# Patient Record
Sex: Male | Born: 1970 | Race: Black or African American | Hispanic: No | Marital: Married | Smoking: Never smoker
Health system: Southern US, Community
[De-identification: ages and names within clinical notes are randomized; demographics above are authoritative.]

## PROBLEM LIST (undated history)

## (undated) DIAGNOSIS — N529 Male erectile dysfunction, unspecified: Secondary | ICD-10-CM

## (undated) DIAGNOSIS — J189 Pneumonia, unspecified organism: Secondary | ICD-10-CM

## (undated) DIAGNOSIS — E669 Obesity, unspecified: Secondary | ICD-10-CM

## (undated) DIAGNOSIS — E785 Hyperlipidemia, unspecified: Secondary | ICD-10-CM

## (undated) HISTORY — DX: Male erectile dysfunction, unspecified: N52.9

## (undated) HISTORY — PX: FOOT FRACTURE SURGERY: SHX645

## (undated) HISTORY — DX: Hyperlipidemia, unspecified: E78.5

## (undated) HISTORY — DX: Obesity, unspecified: E66.9

## (undated) HISTORY — PX: FRACTURE SURGERY: SHX138

---

## 2016-04-06 DIAGNOSIS — L218 Other seborrheic dermatitis: Secondary | ICD-10-CM | POA: Diagnosis not present

## 2016-08-25 DIAGNOSIS — L219 Seborrheic dermatitis, unspecified: Secondary | ICD-10-CM | POA: Diagnosis not present

## 2017-02-02 ENCOUNTER — Other Ambulatory Visit: Payer: Self-pay | Admitting: Occupational Medicine

## 2017-02-02 ENCOUNTER — Ambulatory Visit: Payer: Self-pay

## 2017-02-02 DIAGNOSIS — Z0000 Encounter for general adult medical examination without abnormal findings: Secondary | ICD-10-CM

## 2017-02-08 ENCOUNTER — Ambulatory Visit (INDEPENDENT_AMBULATORY_CARE_PROVIDER_SITE_OTHER): Payer: BLUE CROSS/BLUE SHIELD | Admitting: Adult Health

## 2017-02-08 ENCOUNTER — Encounter: Payer: Self-pay | Admitting: Adult Health

## 2017-02-08 VITALS — BP 122/84 | Temp 98.3°F | Ht 69.0 in | Wt 275.2 lb

## 2017-02-08 DIAGNOSIS — R911 Solitary pulmonary nodule: Secondary | ICD-10-CM | POA: Diagnosis not present

## 2017-02-08 DIAGNOSIS — Z0000 Encounter for general adult medical examination without abnormal findings: Secondary | ICD-10-CM | POA: Diagnosis not present

## 2017-02-08 DIAGNOSIS — N529 Male erectile dysfunction, unspecified: Secondary | ICD-10-CM

## 2017-02-08 LAB — BASIC METABOLIC PANEL
BUN: 15 mg/dL (ref 6–23)
CO2: 27 mEq/L (ref 19–32)
Calcium: 9.2 mg/dL (ref 8.4–10.5)
Chloride: 105 mEq/L (ref 96–112)
Creatinine, Ser: 0.92 mg/dL (ref 0.40–1.50)
GFR: 113.7 mL/min (ref >60.00)
Glucose, Bld: 112 mg/dL — ABNORMAL HIGH (ref 70–99)
POTASSIUM: 4.2 meq/L (ref 3.5–5.1)
SODIUM: 139 meq/L (ref 135–145)

## 2017-02-08 LAB — HEPATIC FUNCTION PANEL
ALBUMIN: 4.1 g/dL (ref 3.5–5.2)
ALT: 27 U/L (ref 0–53)
AST: 23 U/L (ref 0–37)
Alkaline Phosphatase: 97 U/L (ref 39–117)
Bilirubin, Direct: 0.1 mg/dL (ref 0.0–0.3)
Total Bilirubin: 0.6 mg/dL (ref 0.2–1.2)
Total Protein: 7.2 g/dL (ref 6.0–8.3)

## 2017-02-08 LAB — LIPID PANEL
Cholesterol: 208 mg/dL — ABNORMAL HIGH (ref 0–200)
HDL: 52.1 mg/dL (ref >39.00)
LDL Cholesterol: 127 mg/dL — ABNORMAL HIGH (ref 0–99)
NONHDL: 156.19
Total CHOL/HDL Ratio: 4
Triglycerides: 148 mg/dL (ref 0.0–149.0)
VLDL: 29.6 mg/dL (ref 0.0–40.0)

## 2017-02-08 LAB — CBC WITH DIFFERENTIAL/PLATELET
Basophils Absolute: 0 10*3/uL (ref 0.0–0.1)
Basophils Relative: 0.8 % (ref 0.0–3.0)
Eosinophils Absolute: 0.1 10*3/uL (ref 0.0–0.7)
Eosinophils Relative: 3.1 % (ref 0.0–5.0)
HCT: 42.7 % (ref 39.0–52.0)
HEMOGLOBIN: 14.6 g/dL (ref 13.0–17.0)
Lymphocytes Relative: 28.7 % (ref 12.0–46.0)
Lymphs Abs: 1.2 10*3/uL (ref 0.7–4.0)
MCHC: 34.2 g/dL (ref 30.0–36.0)
MCV: 90.2 fl (ref 78.0–100.0)
MONOS PCT: 9.6 % (ref 3.0–12.0)
Monocytes Absolute: 0.4 10*3/uL (ref 0.1–1.0)
Neutro Abs: 2.4 10*3/uL (ref 1.4–7.7)
Neutrophils Relative %: 57.8 % (ref 43.0–77.0)
Platelets: 264 10*3/uL (ref 150.0–400.0)
RBC: 4.74 Mil/uL (ref 4.22–5.81)
RDW: 13.7 % (ref 11.5–15.5)
WBC: 4.1 10*3/uL (ref 4.0–10.5)

## 2017-02-08 LAB — HEMOGLOBIN A1C: HEMOGLOBIN A1C: 5.5 % (ref 4.6–6.5)

## 2017-02-08 LAB — TSH: TSH: 2.17 u[IU]/mL (ref 0.35–4.50)

## 2017-02-08 LAB — PSA: PSA: 0.53 ng/mL (ref 0.10–4.00)

## 2017-02-08 NOTE — Progress Notes (Signed)
Patient presents to clinic today to establish care. He is a pleasant 46 year old male who  has a past medical history of ED (erectile dysfunction).   Acute Concerns: Complete physical   Chronic Issues: Obesity - he has been prescribed Contrave in the past.   ED - he has trouble maintaining erections. This has been an ongoing issue for him. He is overweight and has a lot of stress at work  Pulmonary Nodule- had recent chest x ray done at work for his work physical. X ray showed a nodular density in the right lung that did not correspond with nipple marker. It is recommended that he follow up for a CT. 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 He does not always wear a respirator or maske.   Health Maintenance: Dental -- Routine  Vision -- Routine  Immunizations -- UTD  Diet: Does not follow a specific diet. He travels a lot for work.  Exercise: Does not do routine care   Past Medical History:  Diagnosis Date  . ED (erectile dysfunction)     No past surgical history on file.  No current outpatient prescriptions on file prior to visit.   No current facility-administered medications on file prior to visit.     No Known Allergies  Family History  Problem Relation Age of Onset  . Ovarian cancer Mother   . Stroke Maternal Grandmother     Social History   Social History  . Marital status: Unknown    Spouse name: N/A  . Number of children: N/A  . Years of education: N/A   Occupational History  . Not on file.   Social History Main Topics  . Smoking status: Never Smoker  . Smokeless tobacco: Never Used  . Alcohol use Yes     Comment: "social"  . Drug use: No  . Sexual activity: Not on file   Other Topics Concern  . Not on file   Social History Narrative  . No narrative on file    Review of Systems  Constitutional: Negative.   HENT: Negative.   Eyes: Negative.   Respiratory: Negative.   Cardiovascular: Negative.   Gastrointestinal:  Negative.   Genitourinary: Negative.   Musculoskeletal: Negative.   Skin: Negative.   Neurological: Negative.   Endo/Heme/Allergies: Negative.   Psychiatric/Behavioral: Negative.   All other systems reviewed and are negative.   BP 122/84 (BP Location: Left Arm, Patient Position: Sitting, Cuff Size: Normal)   Temp 98.3 F (36.8 C) (Oral)   Ht 5\' 9"  (1.753 m)   Wt 275 lb 3.2 oz (124.8 kg)   BMI 40.64 kg/m   Physical Exam  Constitutional: He is oriented to person, place, and time and well-developed, well-nourished, and in no distress. No distress.  Obese   HENT:  Head: Normocephalic and atraumatic.  Right Ear: External ear normal.  Left Ear: External ear normal.  Nose: Nose normal.  Mouth/Throat: Oropharynx is clear and moist. No oropharyngeal exudate.  Eyes: Conjunctivae and EOM are normal. Pupils are equal, round, and reactive to light. Right eye exhibits no discharge. Left eye exhibits no discharge. No scleral icterus.  Neck: Trachea normal and normal range of motion. Neck supple. No JVD present. Carotid bruit is not present. No tracheal deviation present. No thyroid mass and no thyromegaly present.  Cardiovascular: Normal rate, regular rhythm, normal heart sounds and intact distal pulses.  Exam reveals no gallop and no friction rub.   No murmur heard. Pulmonary/Chest:  Effort normal and breath sounds normal. No stridor. No respiratory distress. He has no wheezes. He has no rales. He exhibits no tenderness.  Abdominal: Soft. Bowel sounds are normal. He exhibits no distension and no mass. There is no tenderness. There is no rebound and no guarding.  Genitourinary:  Genitourinary Comments: deferred  Musculoskeletal: Normal range of motion. He exhibits no edema, tenderness or deformity.  Lymphadenopathy:    He has no cervical adenopathy.  Neurological: He is alert and oriented to person, place, and time. He displays normal reflexes. No cranial nerve deficit. He exhibits normal  muscle tone. Coordination normal. GCS score is 15.  Skin: Skin is warm and dry. No rash noted. He is not diaphoretic. No erythema. No pallor.  Psychiatric: Mood, memory, affect and judgment normal.  Nursing note and vitals reviewed.  Assessment/Plan: 1. Routine general medical examination at a health care facility - Basic metabolic panel - CBC with Differential/Platelet - Hepatic function panel - Hemoglobin A1c - Lipid panel - TSH - PSA - Testosterone, Free, Total, SHBG - I am going to have him work on diet and exercise and return in 3 months to talk about restarting Contrave  - Follow up sooner if needed   2. Erectile dysfunction, unspecified erectile dysfunction type  - Testosterone, Free, Total, SHBG - Consider referral to Urology  3. Pulmonary nodule  - CT CHEST WO CONTRAST; Future  Dorothyann Peng, NP

## 2017-02-08 NOTE — Addendum Note (Signed)
Addended by: Elmer Picker on: 02/08/2017 08:39 AM   Modules accepted: Orders

## 2017-02-08 NOTE — Addendum Note (Signed)
Addended by: Elmer Picker on: 02/08/2017 08:41 AM   Modules accepted: Orders

## 2017-02-08 NOTE — Patient Instructions (Signed)
It was great meeting you today!   Someone will call you to schedule your CT scan   I will call you with your lab results.   Please follow up with in 3 months to discuss weight loss

## 2017-02-09 LAB — CP2130 TESTOSTERONE, TOTAL AND FREE
SEX HORMONE BINDING: 19 nmol/L (ref 10–50)
TESTOSTERONE FREE: 62.7 pg/mL (ref 47.0–244.0)
TESTOSTERONE-% FREE: 2.6 % (ref 1.6–2.9)
Testosterone: 245 ng/dL — ABNORMAL LOW (ref 300–890)

## 2017-02-12 ENCOUNTER — Inpatient Hospital Stay: Admission: RE | Admit: 2017-02-12 | Payer: Self-pay | Source: Ambulatory Visit

## 2017-02-14 ENCOUNTER — Other Ambulatory Visit: Payer: Self-pay | Admitting: Adult Health

## 2017-02-14 DIAGNOSIS — R7989 Other specified abnormal findings of blood chemistry: Secondary | ICD-10-CM

## 2017-02-19 ENCOUNTER — Encounter: Payer: Self-pay | Admitting: Adult Health

## 2017-02-20 ENCOUNTER — Other Ambulatory Visit: Payer: Self-pay | Admitting: Adult Health

## 2017-02-20 ENCOUNTER — Ambulatory Visit: Payer: Self-pay | Admitting: Adult Health

## 2017-02-20 DIAGNOSIS — N529 Male erectile dysfunction, unspecified: Secondary | ICD-10-CM

## 2017-02-23 ENCOUNTER — Other Ambulatory Visit: Payer: Self-pay | Admitting: Adult Health

## 2017-02-23 ENCOUNTER — Ambulatory Visit (INDEPENDENT_AMBULATORY_CARE_PROVIDER_SITE_OTHER)
Admission: RE | Admit: 2017-02-23 | Discharge: 2017-02-23 | Disposition: A | Discharge status: Home / Self Care | Payer: BLUE CROSS/BLUE SHIELD | Source: Ambulatory Visit | Attending: Adult Health | Admitting: Adult Health

## 2017-02-23 DIAGNOSIS — R911 Solitary pulmonary nodule: Secondary | ICD-10-CM

## 2017-03-15 DIAGNOSIS — N5201 Erectile dysfunction due to arterial insufficiency: Secondary | ICD-10-CM | POA: Diagnosis not present

## 2017-03-15 DIAGNOSIS — E349 Endocrine disorder, unspecified: Secondary | ICD-10-CM | POA: Diagnosis not present

## 2017-03-26 ENCOUNTER — Encounter: Payer: Self-pay | Admitting: Family Medicine

## 2017-05-09 ENCOUNTER — Encounter: Payer: Self-pay | Admitting: Adult Health

## 2017-05-09 ENCOUNTER — Ambulatory Visit (INDEPENDENT_AMBULATORY_CARE_PROVIDER_SITE_OTHER): Payer: BLUE CROSS/BLUE SHIELD | Admitting: Adult Health

## 2017-05-09 VITALS — BP 118/64 | Temp 98.5°F | Ht 69.0 in | Wt 267.9 lb

## 2017-05-09 DIAGNOSIS — Z713 Dietary counseling and surveillance: Secondary | ICD-10-CM | POA: Diagnosis not present

## 2017-05-09 MED ORDER — NALTREXONE-BUPROPION HCL ER 8-90 MG PO TB12: ORAL_TABLET | ORAL | 0 refills | Status: DC

## 2017-05-09 MED ORDER — NALTREXONE-BUPROPION HCL ER 8-90 MG PO TB12
2.0000 | ORAL_TABLET | Freq: Two times a day (BID) | ORAL | 1 refills | Status: DC
Start: 2017-05-09 — End: 2017-06-05

## 2017-05-09 NOTE — Progress Notes (Signed)
Subjective:    Patient ID: Jesus Allen, male    DOB: 28-Sep-1971, 46 y.o.   MRN: [id number]  HPI 46 year old male who  has a past medical history of ED (erectile dysfunction). He presents to the office today to talk about weight loss therapy. His current weight is that of 267.9 which gives him a BMI of 39.   He has been doing the ' Insanity" workout as well as " T-25". He has cut back on sugar and beer and has noticed weight loss. His biggest barrier is not giving in to cravings and portion size.   He has tried Contrave in the past and reports that he did well with this medication.,   He reports that his goal weight is that of 230. Currently his pant size is 40.   Wt Readings from Last 3 Encounters:  05/09/17 267 lb 14.4 oz (121.5 kg)  02/08/17 275 lb 3.2 oz (124.8 kg)     Review of Systems  Constitutional: Negative.   Respiratory: Negative.   Genitourinary: Negative.   Neurological: Negative.   All other systems reviewed and are negative.  Past Medical History:  Diagnosis Date  . ED (erectile dysfunction)     Social History   Social History  . Marital status: Married    Spouse name: N/A  . Number of children: N/A  . Years of education: N/A   Occupational History  . Not on file.   Social History Main Topics  . Smoking status: Never Smoker  . Smokeless tobacco: Never Used  . Alcohol use Yes     Comment: "social"  . Drug use: No  . Sexual activity: Not on file   Other Topics Concern  . Not on file   Social History Narrative   Works as Engineer, building services    Has his MBA   Married    Two children ( 48 and 88)        No past surgical history on file.  Family History  Problem Relation Age of Onset  . Ovarian cancer Mother   . Stroke Maternal Grandmother     No Known Allergies  Current Outpatient Prescriptions on File Prior to Visit  Medication Sig Dispense Refill  . tadalafil (CIALIS) 10 MG tablet Take 10 mg by mouth daily as needed.      No current facility-administered medications on file prior to visit.     BP 118/64 (BP Location: Left Arm, Patient Position: Sitting, Cuff Size: Large)   Temp 98.5 F (36.9 C) (Oral)   Ht 5\' 9"  (1.753 m)   Wt 267 lb 14.4 oz (121.5 kg)   BMI 39.56 kg/m       Objective:   Physical Exam  Constitutional: He is oriented to person, place, and time. He appears well-developed and well-nourished. No distress.  Obese   Cardiovascular: Normal rate, regular rhythm, normal heart sounds and intact distal pulses.  Exam reveals no gallop and no friction rub.   No murmur heard. Pulmonary/Chest: Effort normal and breath sounds normal. No respiratory distress. He has no wheezes. He has no rales. He exhibits no tenderness.  Neurological: He is alert and oriented to person, place, and time.  Skin: Skin is warm and dry. No rash noted. He is not diaphoretic. No erythema. No pallor.  Psychiatric: He has a normal mood and affect. His behavior is normal. Judgment and thought content normal.  Nursing note and vitals reviewed.     Assessment & Plan:  1. Weight loss counseling, encounter for - We spoke about the side effects of medication  - We spoke about meal planning and continued exercise at length  - Naltrexone-Bupropion HCl ER 8-90 MG TB12; Take 2 tablets by mouth 2 (two) times daily. Two tablets twice per day  Dispense: 120 tablet; Refill: 1 - Follow up in one month and then in 3 month intervals.   Dorothyann Peng, NP

## 2017-05-11 ENCOUNTER — Ambulatory Visit: Payer: Self-pay | Admitting: Adult Health

## 2017-05-25 ENCOUNTER — Ambulatory Visit (INDEPENDENT_AMBULATORY_CARE_PROVIDER_SITE_OTHER)
Admission: RE | Admit: 2017-05-25 | Discharge: 2017-05-25 | Disposition: A | Discharge status: Home / Self Care | Payer: BLUE CROSS/BLUE SHIELD | Source: Ambulatory Visit | Attending: Adult Health | Admitting: Adult Health

## 2017-05-25 ENCOUNTER — Other Ambulatory Visit: Payer: Self-pay | Admitting: *Deleted

## 2017-05-25 DIAGNOSIS — R911 Solitary pulmonary nodule: Secondary | ICD-10-CM

## 2017-06-05 ENCOUNTER — Other Ambulatory Visit: Payer: Self-pay | Admitting: Adult Health

## 2017-06-05 DIAGNOSIS — Z713 Dietary counseling and surveillance: Secondary | ICD-10-CM

## 2017-06-06 NOTE — Telephone Encounter (Signed)
Called to the pharmacy and gave to [facility] verbally.

## 2017-06-06 NOTE — Telephone Encounter (Signed)
Ok to refill but change to two tablets BID, 0 refills

## 2017-06-06 NOTE — Telephone Encounter (Signed)
Per last note, pt to follow up in 1 month and then 3 month intervals.  Called and scheduled pt for 06/19/17 @ 1 PM.  Will forward to [facility] for authorization to fill.

## 2017-06-19 ENCOUNTER — Ambulatory Visit: Payer: Self-pay | Admitting: Adult Health

## 2017-07-09 ENCOUNTER — Other Ambulatory Visit: Payer: Self-pay | Admitting: Adult Health

## 2017-07-09 DIAGNOSIS — Z713 Dietary counseling and surveillance: Secondary | ICD-10-CM

## 2017-07-10 NOTE — Telephone Encounter (Signed)
Sent to the pharmacy by e-scribe. 

## 2017-07-10 NOTE — Telephone Encounter (Signed)
Ok to refill for 3 months.  

## 2017-08-03 ENCOUNTER — Encounter: Payer: Self-pay | Admitting: Adult Health

## 2017-08-08 ENCOUNTER — Encounter: Payer: Self-pay | Admitting: Adult Health

## 2017-08-08 ENCOUNTER — Ambulatory Visit (INDEPENDENT_AMBULATORY_CARE_PROVIDER_SITE_OTHER): Payer: BLUE CROSS/BLUE SHIELD | Admitting: Adult Health

## 2017-08-08 DIAGNOSIS — Z713 Dietary counseling and surveillance: Secondary | ICD-10-CM

## 2017-08-08 MED ORDER — NALTREXONE-BUPROPION HCL ER 8-90 MG PO TB12: ORAL_TABLET | ORAL | 1 refills | Status: DC

## 2017-08-08 NOTE — Progress Notes (Signed)
Subjective:    Patient ID: Jesus Allen, male    DOB: [birth year], 46 y.o.   MRN: [id number]  HPI  46 year old male who  has a past medical history of ED (erectile dysfunction) and Obesity. He presents to the office today for three month follow up regarding weight loss and obesity. During his last visit he was placed on Contrave. He had been doing aerobic exercise and working on incorporating a heart healthy diet. He was finding it hard to deal with cravings and portion sizes. His weight at this visit was 267.9 lbs and his pant size was 40.   Today in the office he reports that he has been walking 1.5 miles per day and is trying to eat healthy. He had to go to [employer] for a month for work and found it difficult to stay on track with his diet   He feels as though the Contrave is causing him to drink more water and he has noticed he is eating less when it comes to portion [employer] Readings from Last 3 Encounters:  08/08/17 263 lb (119.3 kg)  05/09/17 267 lb 14.4 oz (121.5 kg)  02/08/17 275 lb 3.2 oz (124.8 kg)     Review of Systems See HPI   Past Medical History:  Diagnosis Date  . ED (erectile dysfunction)   . Obesity     Social History   Social History  . Marital status: Married    Spouse name: N/A  . Number of children: N/A  . Years of education: N/A   Occupational History  . Not on file.   Social History Main Topics  . Smoking status: Never Smoker  . Smokeless tobacco: Never Used  . Alcohol use Yes     Comment: "social"  . Drug use: No  . Sexual activity: Not on file   Other Topics Concern  . 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  Family History  Problem Relation Age of Onset  . Ovarian cancer Mother   . Stroke Maternal Grandmother     No Known Allergies  Current Outpatient Prescriptions on File Prior  to Visit  Medication Sig Dispense Refill  . CONTRAVE 8-90 MG TB12 TAKE TWO TABLETS BY MOUTH TWO TIMES A DAILY 120 tablet 2  . tadalafil (CIALIS) 10 MG tablet Take 10 mg by mouth daily as needed.     No current facility-administered medications on file prior to visit.     There were no vitals taken for this visit.      Objective:   Physical Exam  Constitutional: He is oriented to person, place, and time. He appears well-developed and well-nourished. No distress.  Obese   Cardiovascular: Normal rate, regular rhythm, normal heart sounds and intact distal pulses.  Exam reveals no gallop and no friction rub.   No murmur heard. Pulmonary/Chest: Effort normal and breath sounds normal. No respiratory distress. He has no wheezes. He has no rales. He exhibits no tenderness.  Musculoskeletal: Normal range of motion. He exhibits no edema, tenderness or deformity.  Neurological: He is alert and oriented to person, place, and time. He has normal reflexes. He displays normal reflexes. No cranial nerve deficit. He exhibits normal muscle tone. Coordination normal.  Skin: Skin is  warm and dry. No rash noted. He is not diaphoretic. No erythema. No pallor.  Psychiatric: He has a normal mood and affect. His behavior is normal. Judgment and thought content normal.  Nursing note and vitals reviewed.     Assessment & Plan:  1. Weight loss counseling, encounter for - He did not lose as much weight as we were hoping for. He has been doing the minimum and got the minimum results. I am ok with him continuing Contrave for the next 2 months, if he has not lost 5% of his body weight, we will pull him off the medication  - Naltrexone-Bupropion HCl ER (CONTRAVE) 8-90 MG TB12; TAKE TWO TABLETS BY MOUTH TWO TIMES A DAILY  Dispense: 120 tablet; Refill: 1 - Follow up in 2 months  - Step up aerobic exercise  Dorothyann Peng, NP

## 2017-10-11 ENCOUNTER — Ambulatory Visit: Payer: Self-pay | Admitting: Adult Health

## 2017-11-04 IMAGING — CT CT CHEST W/O CM
2 of 3 series · 15 of 36 positions shown, 18 images · non-contrast
Comparison: 02/02/2017

CLINICAL DATA: Follow-up right lung nodule

EXAM:
CT CHEST WITHOUT CONTRAST
TECHNIQUE: Multidetector CT imaging of the chest was performed following the
standard protocol without IV contrast.

[Series 2: thorax · axial · 0.76mm/px · z∈[-333,-59]mm · 12 of 161 slices shown, 15 images]
[im 12/161  mediastinal]
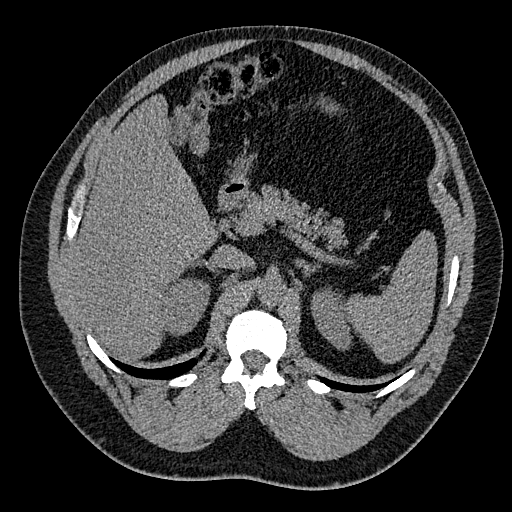
[im 12/161  lung]
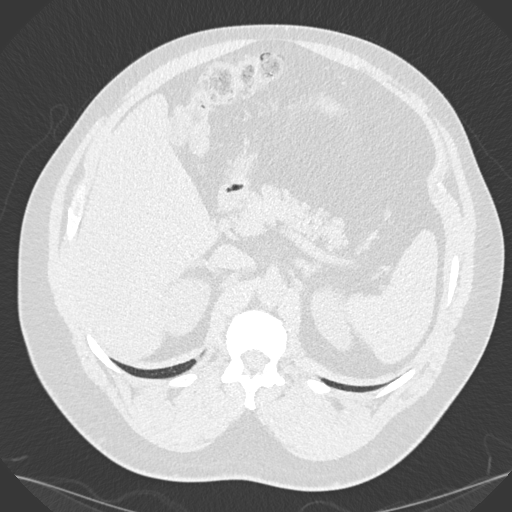
[im 24/161  lung]
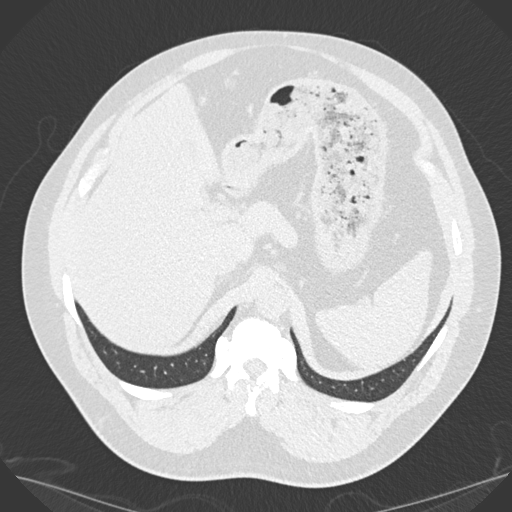
[im 36/161  lung]
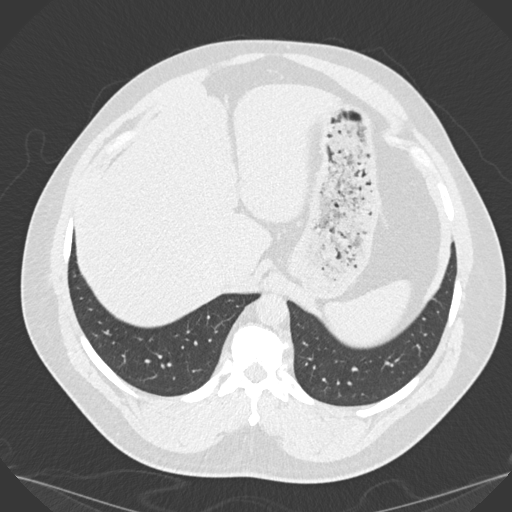
[im 48/161  lung]
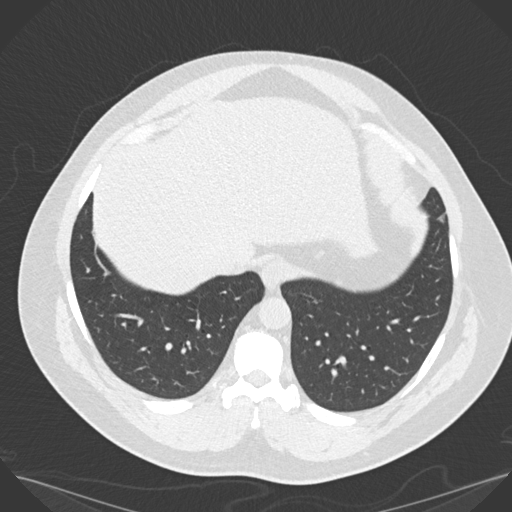
[im 60/161  mediastinal]
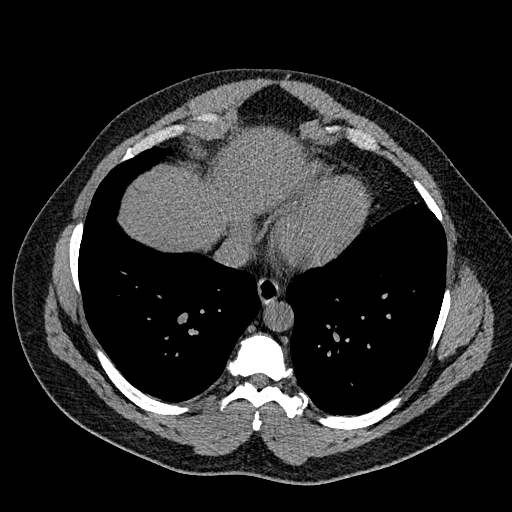
[im 60/161  lung]
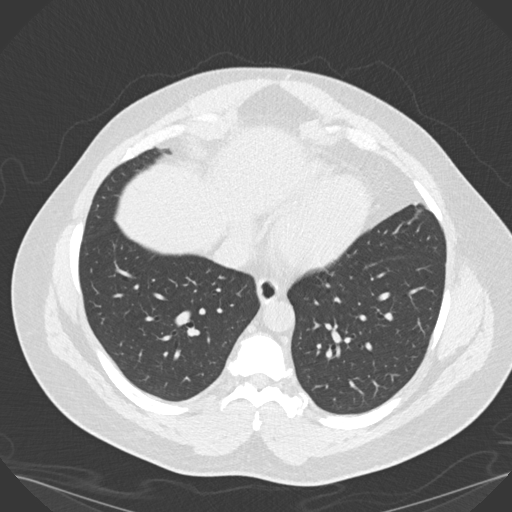
[im 72/161  lung]
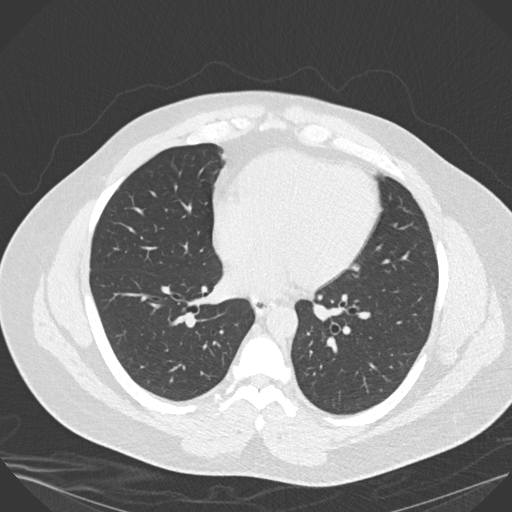
[im 89/161  lung]
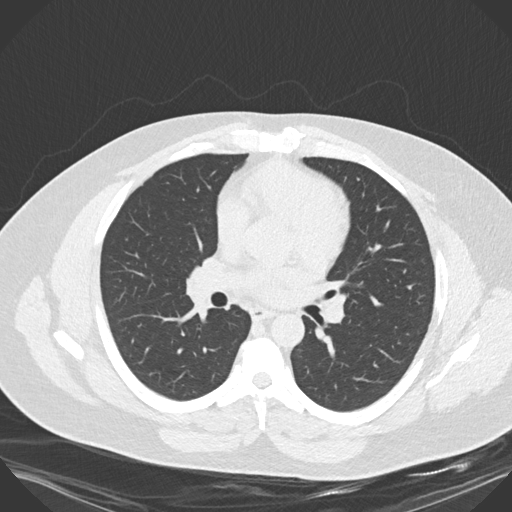
[im 101/161  lung]
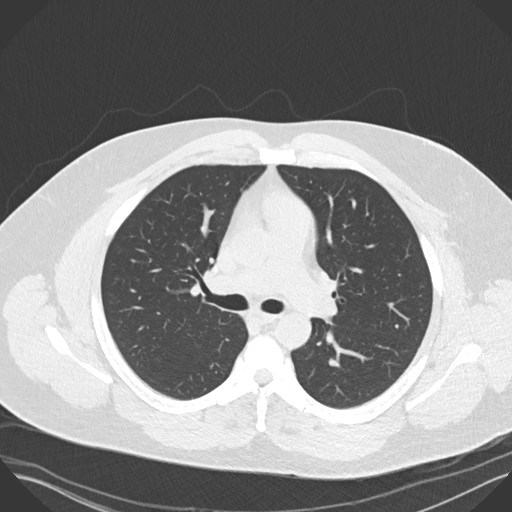
[im 113/161  mediastinal]
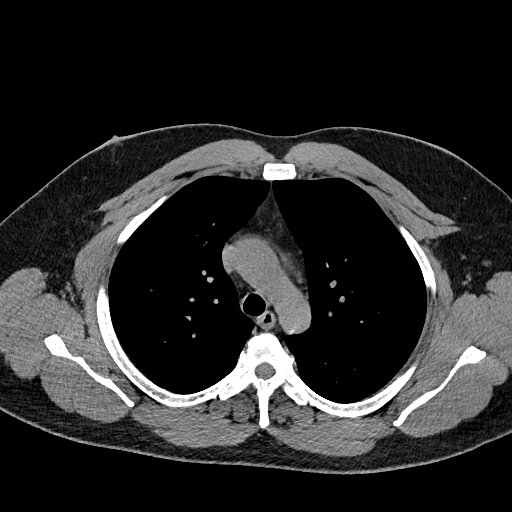
[im 113/161  lung]
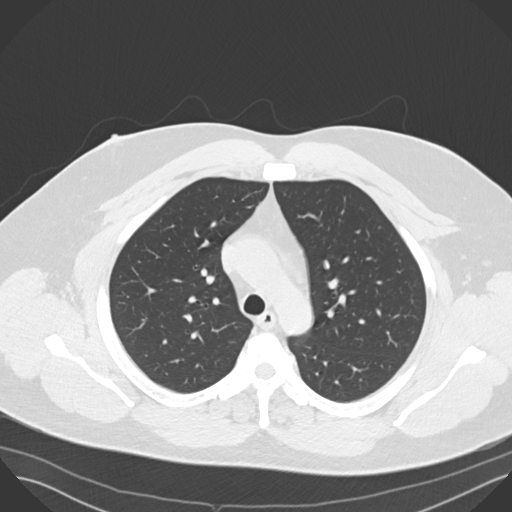
[im 125/161  lung]
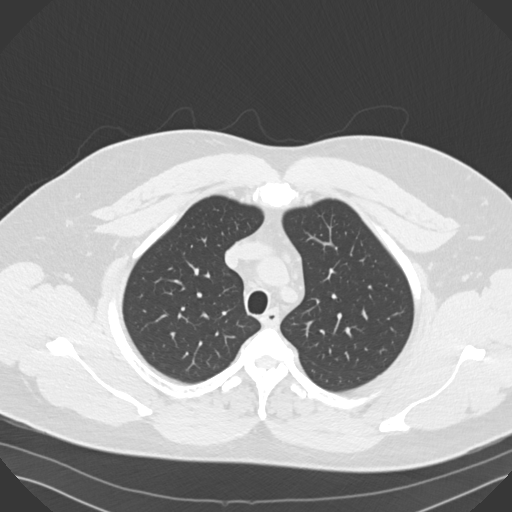
[im 137/161  lung]
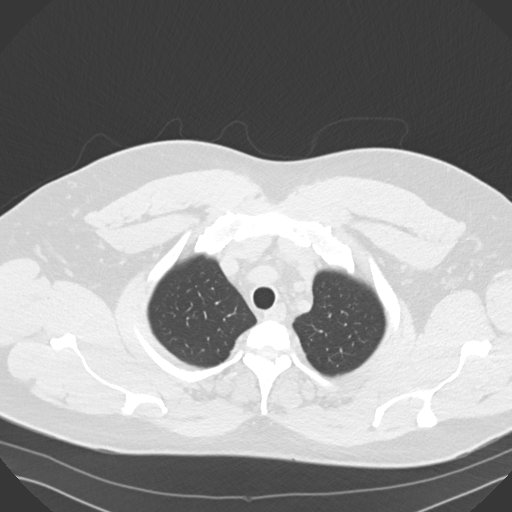
[im 149/161  lung]
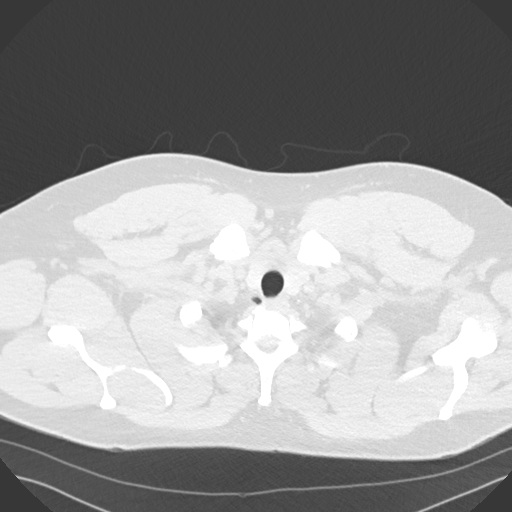

[Series 5: coronal · coronal · 0.63mm/px · 3 of 142 slices shown]
[im 29/142  lung]
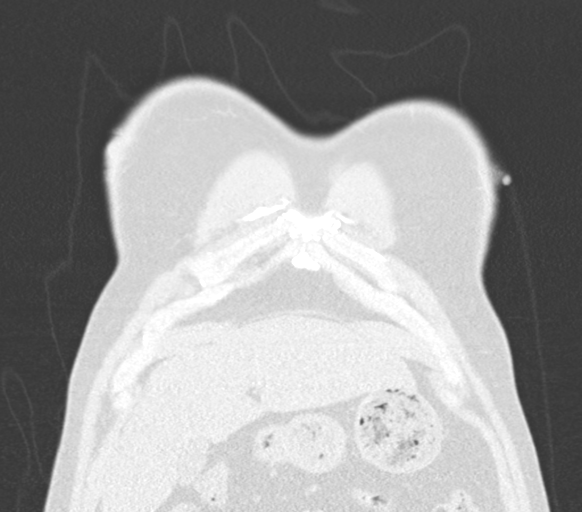
[im 57/142  lung]
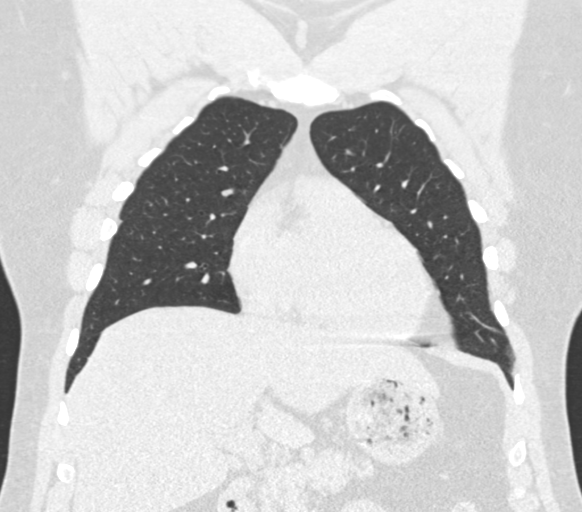
[im 85/142  lung]
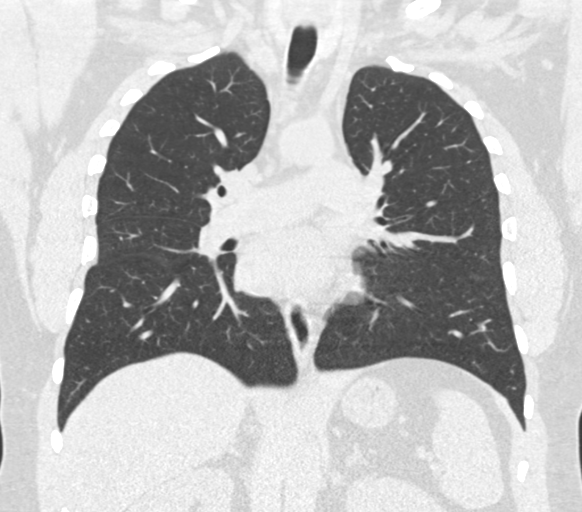

[15 of 36 positions shown; findings below may reference images not displayed]

FINDINGS: Cardiovascular: Somewhat limited due to the lack of IV contrast. The
thoracic aorta is within normal limits. No cardiac enlargement is
seen. No significant coronary calcifications are noted.

Mediastinum/Nodes: Thoracic inlet is within normal limits. No hilar
or mediastinal adenopathy is identified.

Lungs/Pleura: The lungs are well aerated bilaterally. There is a
right middle lobe nodule along the major fissure best seen on image
number 33 of series 6 measuring 9 x 8 mm. No other nodular changes
are seen. No significant pleural retraction is noted.

Upper Abdomen: Gallbladder is decompressed. The visualized upper
abdomen is otherwise within normal limits.

Musculoskeletal:  No acute abnormality noted.
IMPRESSION: Right middle lobe nodule measuring up to 9 mm. Consider one of the
following in 3 months for both low-risk and high-risk individuals:
(a) repeat chest CT, (b) follow-up PET-CT, or (c) tissue sampling.
This recommendation follows the consensus statement: Guidelines for
Management of Incidental Pulmonary Nodules Detected on CT Images:

## 2017-12-17 DIAGNOSIS — L219 Seborrheic dermatitis, unspecified: Secondary | ICD-10-CM | POA: Diagnosis not present

## 2018-03-28 DIAGNOSIS — N5201 Erectile dysfunction due to arterial insufficiency: Secondary | ICD-10-CM | POA: Diagnosis not present

## 2018-04-02 ENCOUNTER — Encounter: Payer: Self-pay | Admitting: Family Medicine

## 2018-08-30 ENCOUNTER — Emergency Department (HOSPITAL_COMMUNITY)
Admission: EM | Admit: 2018-08-30 | Discharge: 2018-08-30 | Disposition: A | Discharge status: Home / Self Care | Payer: BLUE CROSS/BLUE SHIELD | Attending: Emergency Medicine | Admitting: Emergency Medicine

## 2018-08-30 ENCOUNTER — Other Ambulatory Visit: Payer: Self-pay

## 2018-08-30 DIAGNOSIS — M542 Cervicalgia: Secondary | ICD-10-CM | POA: Diagnosis not present

## 2018-08-30 DIAGNOSIS — Y92410 Unspecified street and highway as the place of occurrence of the external cause: Secondary | ICD-10-CM | POA: Diagnosis not present

## 2018-08-30 DIAGNOSIS — M545 Low back pain, unspecified: Secondary | ICD-10-CM

## 2018-08-30 DIAGNOSIS — Y998 Other external cause status: Secondary | ICD-10-CM | POA: Insufficient documentation

## 2018-08-30 DIAGNOSIS — Y939 Activity, unspecified: Secondary | ICD-10-CM | POA: Diagnosis not present

## 2018-08-30 DIAGNOSIS — Z79899 Other long term (current) drug therapy: Secondary | ICD-10-CM | POA: Insufficient documentation

## 2018-08-30 MED ORDER — CYCLOBENZAPRINE HCL 10 MG PO TABS: 10.0000 mg | ORAL_TABLET | Freq: Two times a day (BID) | ORAL | 0 refills | Status: DC | PRN

## 2018-08-30 MED ORDER — NAPROXEN 500 MG PO TABS: 500.0000 mg | ORAL_TABLET | Freq: Two times a day (BID) | ORAL | 0 refills | Status: DC

## 2018-08-30 MED ORDER — NAPROXEN 500 MG PO TABS
500.0000 mg | ORAL_TABLET | Freq: Once | ORAL | Status: AC
  Administered 2018-08-30: 500 mg via ORAL
  Filled 2018-08-30: qty 1

## 2018-08-30 NOTE — Discharge Instructions (Addendum)
The pain you're experiencing is likely due to muscle strain, you may take Naprosyn and Flexeril as needed for pain management. Do not combine with any pain reliever other than tylenol.   You can also use over-the-counter Biofreeze or salon pas lidocaine patches.  The muscle soreness should improve over the next week. Follow up with your family doctor in the next week for a recheck if you are still having symptoms. Return to ED if pain is worsening, you develop weakness or numbness of extremities, or new or concerning symptoms develop.
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
## 2018-08-30 NOTE — ED Notes (Signed)
Patient verbalized understanding of discharge instructions, no questions. Patient ambulated out of ED with steady gait in no distress.  

## 2019-02-25 ENCOUNTER — Other Ambulatory Visit: Payer: Self-pay

## 2019-02-25 ENCOUNTER — Ambulatory Visit (INDEPENDENT_AMBULATORY_CARE_PROVIDER_SITE_OTHER): Payer: BLUE CROSS/BLUE SHIELD | Admitting: Adult Health

## 2019-02-25 ENCOUNTER — Encounter: Payer: Self-pay | Admitting: Adult Health

## 2019-02-25 DIAGNOSIS — S76212A Strain of adductor muscle, fascia and tendon of left thigh, initial encounter: Secondary | ICD-10-CM | POA: Diagnosis not present

## 2019-02-25 MED ORDER — CYCLOBENZAPRINE HCL 10 MG PO TABS: 10.0000 mg | ORAL_TABLET | Freq: Every day | ORAL | 0 refills | Status: DC

## 2019-02-25 MED ORDER — METHYLPREDNISOLONE 4 MG PO TBPK: ORAL_TABLET | ORAL | 0 refills | Status: DC

## 2019-02-25 NOTE — Progress Notes (Signed)
Virtual Visit via Video Note  I connected with Jesus Allen on 02/25/19 at  1:30 PM EDT by a video enabled telemedicine application and verified that I am speaking with the correct person using two identifiers.  Location patient: home Location provider:work or home office Persons participating in the virtual visit: patient, provider  I discussed the limitations of evaluation and management by telemedicine and the availability of in person appointments. The patient expressed understanding and agreed to proceed.   HPI: 48 year old male who is being evaluated today for an acute issue of left groin pain.  This pain has been present for 2 to 3 weeks, pain is not getting any better but not getting any worse either.  He reports when he first noticed the pain he had been exercising more and was running.  He went to get into bed one evening and felt a "popping" sensation and now reports a "soreness".  Discomfort radiates down his left thigh.  Is worse when he sleeping, bending, or has been sitting for extended periods of time and gets up to walk.  While he is walking "after I get warmed up the pain goes away".  He denies any bruising, redness, or warmth.  Using over-the-counter Biofreeze as well as an ice pad which helps dull the discomfort   ROS: See pertinent positives and negatives per HPI.  Past Medical History:  Diagnosis Date  . ED (erectile dysfunction)   . Obesity     History reviewed. No pertinent surgical history.  Family History  Problem Relation Age of Onset  . Ovarian cancer Mother   . Stroke Maternal Grandmother       Current Outpatient Medications:  .  cyclobenzaprine (FLEXERIL) 10 MG tablet, Take 1 tablet (10 mg total) by mouth at bedtime., Disp: 20 tablet, Rfl: 0 .  methylPREDNISolone (MEDROL DOSEPAK) 4 MG TBPK tablet, Take as directed, Disp: 21 tablet, Rfl: 0 .  Naltrexone-Bupropion HCl ER (CONTRAVE) 8-90 MG TB12, TAKE TWO TABLETS BY MOUTH TWO TIMES A DAILY, Disp:  120 tablet, Rfl: 1 .  naproxen (NAPROSYN) 500 MG tablet, Take 1 tablet (500 mg total) by mouth 2 (two) times daily., Disp: 30 tablet, Rfl: 0 .  sildenafil (VIAGRA) 100 MG tablet, Take 100 mg by mouth daily as needed for erectile dysfunction., Disp: , Rfl:   EXAM:  VITALS per patient if applicable:  GENERAL: alert, oriented, appears well and in no acute distress  HEENT: atraumatic, conjunttiva clear, no obvious abnormalities on inspection of external nose and ears  NECK: normal movements of the head and neck  LUNGS: on inspection no signs of respiratory distress, breathing rate appears normal, no obvious gross SOB, gasping or wheezing  CV: no obvious cyanosis  MS: moves all visible extremities without noticeable abnormality  PSYCH/NEURO: pleasant and cooperative, no obvious depression or anxiety, speech and thought processing grossly intact  ASSESSMENT AND PLAN:  Discussed the following assessment and plan:  Sam consistent with muscle strain.  Will prescribe Flexeril nightly as well as a Medrol Dosepak.  He was advised to take Motrin as needed.  Can also use a heating pad.  Encourage to rest with exercise the next couple of days until healed.  Follow-up if not resolved in 4 to 5 days  Groin strain, left, initial encounter - Plan: cyclobenzaprine (FLEXERIL) 10 MG tablet, methylPREDNISolone (MEDROL DOSEPAK) 4 MG TBPK tablet     I discussed the assessment and treatment plan with the patient. The patient was provided an opportunity to ask questions  and all were answered. The patient agreed with the plan and demonstrated an understanding of the instructions.   The patient was advised to call back or seek an in-person evaluation if the symptoms worsen or if the condition fails to improve as anticipated.   Dorothyann Peng, NP

## 2019-03-18 ENCOUNTER — Other Ambulatory Visit: Payer: Self-pay | Admitting: Adult Health

## 2019-03-18 DIAGNOSIS — S76212A Strain of adductor muscle, fascia and tendon of left thigh, initial encounter: Secondary | ICD-10-CM

## 2019-03-19 NOTE — Telephone Encounter (Signed)
Denied.  Pt should contact provider if still have discomfort.

## 2019-10-29 DIAGNOSIS — L219 Seborrheic dermatitis, unspecified: Secondary | ICD-10-CM | POA: Diagnosis not present

## 2019-11-04 DIAGNOSIS — N5201 Erectile dysfunction due to arterial insufficiency: Secondary | ICD-10-CM | POA: Diagnosis not present

## 2019-12-08 ENCOUNTER — Encounter: Payer: Self-pay | Admitting: Adult Health

## 2019-12-08 DIAGNOSIS — U071 COVID-19: Secondary | ICD-10-CM | POA: Diagnosis not present

## 2019-12-08 DIAGNOSIS — Z20828 Contact with and (suspected) exposure to other viral communicable diseases: Secondary | ICD-10-CM | POA: Diagnosis not present

## 2019-12-10 ENCOUNTER — Ambulatory Visit: Payer: BC Managed Care – PPO | Attending: Internal Medicine

## 2019-12-10 DIAGNOSIS — Z20822 Contact with and (suspected) exposure to covid-19: Secondary | ICD-10-CM

## 2019-12-12 LAB — NOVEL CORONAVIRUS, NAA

## 2020-01-06 DIAGNOSIS — Z20828 Contact with and (suspected) exposure to other viral communicable diseases: Secondary | ICD-10-CM | POA: Diagnosis not present

## 2020-01-06 DIAGNOSIS — Z03818 Encounter for observation for suspected exposure to other biological agents ruled out: Secondary | ICD-10-CM | POA: Diagnosis not present

## 2020-03-08 ENCOUNTER — Other Ambulatory Visit: Payer: Self-pay

## 2020-03-09 ENCOUNTER — Ambulatory Visit (INDEPENDENT_AMBULATORY_CARE_PROVIDER_SITE_OTHER): Payer: BC Managed Care – PPO | Admitting: Adult Health

## 2020-03-09 ENCOUNTER — Encounter: Payer: Self-pay | Admitting: Adult Health

## 2020-03-09 VITALS — BP 120/88 | Temp 98.3°F | Wt 258.0 lb

## 2020-03-09 DIAGNOSIS — S76302A Unspecified injury of muscle, fascia and tendon of the posterior muscle group at thigh level, left thigh, initial encounter: Secondary | ICD-10-CM

## 2020-03-09 MED ORDER — CYCLOBENZAPRINE HCL 10 MG PO TABS: 10.0000 mg | ORAL_TABLET | Freq: Three times a day (TID) | ORAL | 0 refills | Status: DC | PRN

## 2020-03-09 MED ORDER — METHYLPREDNISOLONE 4 MG PO TBPK: ORAL_TABLET | ORAL | 0 refills | Status: DC

## 2020-03-09 NOTE — Progress Notes (Signed)
Subjective:    Patient ID: Jesus Allen, male    DOB: 12/02/1970, 49 y.o.   MRN: [id number]  HPI 49 year old male who  has a past medical history of ED (erectile dysfunction) and Obesity.  He presents to the office today for an acute issue of left upper leg pain. His pain started about 1-2 weeks ago after he was walking his dog and had to make a sudden move to his left; since that time he has had a pain under his left buttocks. Pain is described as aching. Is worse when bending over putting on his shoes and socks and when sitting for long periods of time.   He started taking Tylenol today but has not noticed any improvement and has been using muscle rubs but this has not helped either    Review of Systems See HPI   Past Medical History:  Diagnosis Date  . ED (erectile dysfunction)   . Obesity     Social History   Socioeconomic History  . Marital status: Married    Spouse name: Not on file  . Number of children: Not on file  . Years of education: Not on file  . Highest education level: Not on file  Occupational History  . Not on file  Tobacco Use  . Smoking status: Never Smoker  . Smokeless tobacco: Never Used  Substance and Sexual Activity  . Alcohol use: Yes    Comment: "social"  . Drug use: No  . Sexual activity: Not on file  Other Topics Concern  . Not on file  Social History Narrative   Works as Engineer, building services    Has his MBA   Married    Two children ( 55 and 15)    Social Determinants of Radio broadcast assistant Strain:   . Difficulty of Paying Living Expenses:   Food Insecurity:   . Worried About Charity fundraiser in the Last Year:   . Arboriculturist in the Last Year:   Transportation Needs:   . Film/video editor (Medical):   Marland Kitchen Lack of Transportation (Non-Medical):   Physical Activity:   . Days of Exercise per Week:   . Minutes of Exercise per Session:   Stress:   . Feeling of Stress :   Social Connections:   . Frequency  of Communication with Friends and Family:   . Frequency of Social Gatherings with Friends and Family:   . Attends Religious Services:   . Active Member of Clubs or Organizations:   . Attends Archivist Meetings:   Marland Kitchen Marital Status:   Intimate Partner Violence:   . Fear of Current or Ex-Partner:   . Emotionally Abused:   Marland Kitchen Physically Abused:   . Sexually Abused:     No past surgical history on file.  Family History  Problem Relation Age of Onset  . Ovarian cancer Mother   . Stroke Maternal Grandmother     No Known Allergies  Current Outpatient Medications on File Prior to Visit  Medication Sig Dispense Refill  . cyclobenzaprine (FLEXERIL) 10 MG tablet Take 1 tablet (10 mg total) by mouth at bedtime. 20 tablet 0  . methylPREDNISolone (MEDROL DOSEPAK) 4 MG TBPK tablet Take as directed 21 tablet 0  . Naltrexone-Bupropion HCl ER (CONTRAVE) 8-90 MG TB12 TAKE TWO TABLETS BY MOUTH TWO TIMES A DAILY 120 tablet 1  . naproxen (NAPROSYN) 500 MG tablet Take 1 tablet (500 mg total) by mouth  2 (two) times daily. 30 tablet 0  . sildenafil (VIAGRA) 100 MG tablet Take 100 mg by mouth daily as needed for erectile dysfunction.     No current facility-administered medications on file prior to visit.    There were no vitals taken for this visit.      Objective:   Physical Exam Vitals and nursing note reviewed.  Constitutional:      Appearance: Normal appearance. He is obese.  Musculoskeletal:        General: No swelling or tenderness. Normal range of motion.     Comments: No tenderness with palpation to left hamstring. With knee to chest he felt pain in the hamstring.   Skin:    General: Skin is warm and dry.     Capillary Refill: Capillary refill takes less than 2 seconds.  Neurological:     General: No focal deficit present.     Mental Status: He is alert and oriented to person, place, and time.  Psychiatric:        Mood and Affect: Mood normal.        Behavior: Behavior  normal.        Thought Content: Thought content normal.        Judgment: Judgment normal.        Assessment & Plan:  1. Hamstring injury, left, initial encounter  - cyclobenzaprine (FLEXERIL) 10 MG tablet; Take 1 tablet (10 mg total) by mouth 3 (three) times daily as needed for muscle spasms.  Dispense: 30 tablet; Refill: 0 - methylPREDNISolone (MEDROL DOSEPAK) 4 MG TBPK tablet; Take as directed  Dispense: 21 tablet; Refill: 0 - ice and rest - Follow up if no improvement in the next 2-3 days   Dorothyann Peng, NP

## 2020-03-09 NOTE — Patient Instructions (Addendum)
It was great seeing you today   Your exam is consistent with a muscle strain. I have sent in prednisone and flexeril ( take a night as it can make you sleepy)   Please schedule your physical exam.

## 2020-03-15 ENCOUNTER — Telehealth: Payer: Self-pay | Admitting: Adult Health

## 2020-03-15 DIAGNOSIS — S76302A Unspecified injury of muscle, fascia and tendon of the posterior muscle group at thigh level, left thigh, initial encounter: Secondary | ICD-10-CM

## 2020-03-15 NOTE — Telephone Encounter (Signed)
Pt states since seeing Tommi Rumps last on 4/27 , the pain has now radiate down the left leg. He states it is painful when bending the leg and feels it has gotten worse. He is wondering if something else can be prescribed or what he needs to do from here.   Pt can be reached at [phone]

## 2020-03-16 NOTE — Telephone Encounter (Signed)
REFERRAL PLACED.  PT NOTIFIED.

## 2020-03-16 NOTE — Addendum Note (Signed)
Addended by: Miles Costain T on: 03/16/2020 10:54 AM   Modules accepted: Orders

## 2020-03-16 NOTE — Telephone Encounter (Signed)
We can refer to PT

## 2020-04-02 ENCOUNTER — Emergency Department (HOSPITAL_COMMUNITY): Payer: BC Managed Care – PPO

## 2020-04-02 ENCOUNTER — Emergency Department (HOSPITAL_COMMUNITY)
Admission: EM | Admit: 2020-04-02 | Discharge: 2020-04-02 | Disposition: A | Discharge status: Home / Self Care | Payer: BC Managed Care – PPO | Attending: Emergency Medicine | Admitting: Emergency Medicine

## 2020-04-02 ENCOUNTER — Encounter (HOSPITAL_COMMUNITY): Payer: Self-pay

## 2020-04-02 ENCOUNTER — Other Ambulatory Visit: Payer: Self-pay

## 2020-04-02 DIAGNOSIS — Z79899 Other long term (current) drug therapy: Secondary | ICD-10-CM | POA: Diagnosis not present

## 2020-04-02 DIAGNOSIS — R072 Precordial pain: Secondary | ICD-10-CM | POA: Diagnosis not present

## 2020-04-02 DIAGNOSIS — R0789 Other chest pain: Secondary | ICD-10-CM | POA: Insufficient documentation

## 2020-04-02 DIAGNOSIS — R079 Chest pain, unspecified: Secondary | ICD-10-CM | POA: Diagnosis not present

## 2020-04-02 LAB — CBC
HCT: 41.4 % (ref 39.0–52.0)
Hemoglobin: 14 g/dL (ref 13.0–17.0)
MCH: 32 pg (ref 26.0–34.0)
MCHC: 33.8 g/dL (ref 30.0–36.0)
MCV: 94.5 fL (ref 80.0–100.0)
Platelets: 261 10*3/uL (ref 150–400)
RBC: 4.38 MIL/uL (ref 4.22–5.81)
RDW: 13 % (ref 11.5–15.5)
WBC: 4.1 10*3/uL (ref 4.0–10.5)
nRBC: 0 % (ref 0.0–0.2)

## 2020-04-02 LAB — TROPONIN I (HIGH SENSITIVITY): Troponin I (High Sensitivity): 5 ng/L (ref <18)

## 2020-04-02 LAB — BASIC METABOLIC PANEL
Anion gap: 9 (ref 5–15)
BUN: 10 mg/dL (ref 6–20)
CO2: 28 mmol/L (ref 22–32)
Calcium: 9 mg/dL (ref 8.9–10.3)
Chloride: 105 mmol/L (ref 98–111)
Creatinine, Ser: 0.87 mg/dL (ref 0.61–1.24)
GFR calc Af Amer: >60 mL/min (ref >60)
GFR calc non Af Amer: >60 mL/min (ref >60)
Glucose, Bld: 113 mg/dL — ABNORMAL HIGH (ref 70–99)
Potassium: 4 mmol/L (ref 3.5–5.1)
Sodium: 142 mmol/L (ref 135–145)

## 2020-04-02 LAB — D-DIMER, QUANTITATIVE: D-Dimer, Quant: 0.36 ug/mL-FEU (ref 0.00–0.50)

## 2020-04-02 MED ORDER — SODIUM CHLORIDE 0.9% FLUSH: 3.0000 mL | Freq: Once | INTRAVENOUS | Status: DC

## 2020-04-02 NOTE — Discharge Instructions (Signed)
All your lab work today was normal including the blood test to the heart, blood tests for blood clot and your EKG.  Your x-ray was normal today as well.  There is no sign of heart attack, blood clot, kidney problems.  This is most likely something related to your bones and joints.  Will most likely get better on its own however follow-up with your doctor about it if it is not getting better.  It is okay to continue Aleve but you may need to add Tylenol if it is really bothering you.  If you start having significant shortness of breath, fever, bad cough or other additional symptoms to the pain follow-up with your doctor or return to the emergency room.

## 2020-04-02 NOTE — ED Provider Notes (Signed)
[facility] DEPT Provider Note   CSN: [id number] Arrival date & time: 04/02/20  1349     History Chief Complaint  Patient presents with  . Chest Pain    Jesus Allen is a 49 y.o. male.  The history is provided by the patient.  Chest Pain Pain location:  Substernal area Pain quality: sharp and tightness   Pain radiates to:  Does not radiate Pain severity:  Moderate Onset quality:  Gradual Duration:  3 days Timing:  Intermittent Progression:  Worsening Chronicity:  New Context: lifting and movement   Context: not breathing   Context comment:  Reports only has pain when goes from sitting to standing or twists his trunk from left to right Relieved by:  Rest Worsened by:  Movement Ineffective treatments: has been taking aleve regularly but does not feel it helps. Associated symptoms: no abdominal pain, no anorexia, no back pain, no cough, no diaphoresis, no fatigue, no fever, no lower extremity edema, no nausea, no near-syncope, no palpitations, no shortness of breath, no vomiting and no weakness   Associated symptoms comment:  No DOE Risk factors comment:  Hx of obesity.  no hx of asthma or lung disease.  recent 8 hours in the car 3 weeks ago for work but no leg pain.      Past Medical History:  Diagnosis Date  . ED (erectile dysfunction)   . Obesity     There are no problems to display for this patient.   Past Surgical History:  Procedure Laterality Date  . FOOT FRACTURE SURGERY Left        Family History  Problem Relation Age of Onset  . Ovarian cancer Mother   . Stroke Maternal Grandmother   . Diabetes Father   . Blindness Father     Social History   Tobacco Use  . Smoking status: Never Smoker  . Smokeless tobacco: Never Used  Substance Use Topics  . Alcohol use: Yes    Comment: "social"  . Drug use: No    Home Medications Prior to Admission medications   Medication Sig Start Date End Date Taking?  Authorizing Provider  cyclobenzaprine (FLEXERIL) 10 MG tablet Take 1 tablet (10 mg total) by mouth 3 (three) times daily as needed for muscle spasms. 03/09/20   Nafziger, Tommi Rumps, NP  methylPREDNISolone (MEDROL DOSEPAK) 4 MG TBPK tablet Take as directed 03/09/20   Dorothyann Peng, NP  sildenafil (VIAGRA) 100 MG tablet Take 100 mg by mouth daily as needed for erectile dysfunction.    Kathie Rhodes, MD    Allergies    Patient has no known allergies.  Review of Systems   Review of Systems  Constitutional: Negative for diaphoresis, fatigue and fever.  Respiratory: Negative for cough and shortness of breath.   Cardiovascular: Positive for chest pain. Negative for palpitations and near-syncope.  Gastrointestinal: Negative for abdominal pain, anorexia, nausea and vomiting.  Musculoskeletal: Negative for back pain.  Neurological: Negative for weakness.  All other systems reviewed and are negative.   Physical Exam Updated Vital Signs BP (!) 157/98 (BP Location: Right Arm)   Pulse 100   Temp 98.1 F (36.7 C) (Oral)   Resp 20   Ht 5\' 9"  (1.753 m)   Wt 114.3 kg   SpO2 100%   BMI 37.21 kg/m   Physical Exam Vitals and nursing note reviewed.  Constitutional:      General: He is not in acute distress.    Appearance: He is well-developed. He  is obese.  HENT:     Head: Normocephalic and atraumatic.  Eyes:     Conjunctiva/sclera: Conjunctivae normal.     Pupils: Pupils are equal, round, and reactive to light.  Cardiovascular:     Rate and Rhythm: Regular rhythm. Tachycardia present.     Heart sounds: No murmur.  Pulmonary:     Effort: Pulmonary effort is normal. No respiratory distress.     Breath sounds: Normal breath sounds. No wheezing or rales.  Chest:     Chest wall: Tenderness present.    Abdominal:     General: There is no distension.     Palpations: Abdomen is soft.     Tenderness: There is no abdominal tenderness. There is no guarding or rebound.  Musculoskeletal:         General: No tenderness. Normal range of motion.     Cervical back: Normal range of motion and neck supple.     Right lower leg: No edema.     Left lower leg: No edema.  Skin:    General: Skin is warm and dry.     Findings: No erythema or rash.  Neurological:     General: No focal deficit present.     Mental Status: He is alert and oriented to person, place, and time. Mental status is at baseline.  Psychiatric:        Mood and Affect: Mood normal.        Behavior: Behavior normal.        Thought Content: Thought content normal.     ED Results / Procedures / Treatments   Labs (all labs ordered are listed, but only abnormal results are displayed) Labs Reviewed  BASIC METABOLIC PANEL - Abnormal; Notable for the following components:      Result Value   Glucose, Bld 113 (*)    All other components within normal limits  CBC  D-DIMER, QUANTITATIVE (NOT AT [facility])  TROPONIN I (HIGH SENSITIVITY)    EKG EKG Interpretation  Date/Time:  Friday Apr 02 2020 14:20:25 EDT Ventricular Rate:  98 PR Interval:    QRS Duration: 84 QT Interval:  347 QTC Calculation: 443 R Axis:   30 Text Interpretation: Sinus rhythm Borderline T wave abnormalities No previous tracing Confirmed by Blanchie Dessert [phone]) on 04/02/2020 2:30:11 PM   Radiology DG Chest 2 View  Result Date: 04/02/2020 CLINICAL DATA:  Chest pain X 2 days. EXAM: CHEST - 2 VIEW COMPARISON:  February 02, 2017 FINDINGS: The heart size and mediastinal contours are within normal limits. Both lungs are clear. The visualized skeletal structures are unremarkable. IMPRESSION: No active cardiopulmonary disease. Electronically Signed   By: Virgina Norfolk M.D.   On: 04/02/2020 15:00    Procedures Procedures (including critical care time)  Medications Ordered in ED Medications - No data to display  ED Course  I have reviewed the triage vital signs and the nursing notes.  Pertinent labs & imaging results that were available during my  care of the patient were reviewed by me and considered in my medical decision making (see chart for details).    MDM Rules/Calculators/A&P                      Patient is a 49 year old male presenting today with atypical chest pain.  Patient reports for the last 3 days he has had chest pain that is very specific in the distal sternum and only present when he goes from sitting to standing or  rotates his upper body.  Patient did recently have an 8-hour car trip 3 weeks ago but denies any leg pain or swelling.  He is mildly tachycardic on exam.  He has no URI symptoms, neurologic complaints and no prior history of respiratory disease.  Patient's lungs are clear but does have point tenderness in his sternum.  EKG without acute findings.  Low suspicion for dissection however slightly higher risk for PE with tachycardia and long trip.  Heart score of 2 and lower suspicion for ACS at this time.  If initial troponin is negative feel that we can stop given his symptoms have been intermittent for the last 3 days. Chest x-ray and lab work are pending.\  3:17 PM Labs are all wnl. CXR without acute findings.  Feel pt's sx are msk at this time.  Will d/c home.  MDM Number of Diagnoses or Management Options   Amount and/or Complexity of Data Reviewed Clinical lab tests: ordered and reviewed Tests in the radiology section of CPT: ordered and reviewed Tests in the medicine section of CPT: ordered and reviewed Decide to obtain previous medical records or to obtain history from someone other than the patient: yes Obtain history from someone other than the patient: no Discuss the patient with other providers: no Independent visualization of images, tracings, or specimens: yes  Risk of Complications, Morbidity, and/or Mortality Presenting problems: moderate Diagnostic procedures: low Management options: low  Patient Progress Patient progress: stable    Final Clinical Impression(s) / ED  Diagnoses Final diagnoses:  Atypical chest pain    Rx / DC Orders ED Discharge Orders    None       Blanchie Dessert, MD 04/02/20 1519

## 2020-04-02 NOTE — ED Triage Notes (Signed)
Patient c/ointermittent  mid chest pain x 2 days. Patient states pain is worse when he is moving around. patient denies any SOB, diaphoresis, or N/V.

## 2020-04-06 ENCOUNTER — Other Ambulatory Visit: Payer: Self-pay

## 2020-04-07 ENCOUNTER — Encounter: Payer: Self-pay | Admitting: Adult Health

## 2020-04-07 ENCOUNTER — Ambulatory Visit (INDEPENDENT_AMBULATORY_CARE_PROVIDER_SITE_OTHER): Payer: BC Managed Care – PPO | Admitting: Adult Health

## 2020-04-07 VITALS — BP 130/94 | Temp 97.5°F | Ht 70.0 in | Wt 267.0 lb

## 2020-04-07 DIAGNOSIS — Z125 Encounter for screening for malignant neoplasm of prostate: Secondary | ICD-10-CM | POA: Diagnosis not present

## 2020-04-07 DIAGNOSIS — Z0000 Encounter for general adult medical examination without abnormal findings: Secondary | ICD-10-CM | POA: Diagnosis not present

## 2020-04-07 DIAGNOSIS — E782 Mixed hyperlipidemia: Secondary | ICD-10-CM | POA: Diagnosis not present

## 2020-04-07 DIAGNOSIS — E668 Other obesity: Secondary | ICD-10-CM

## 2020-04-07 LAB — COMPREHENSIVE METABOLIC PANEL
ALT: 25 U/L (ref 0–53)
AST: 26 U/L (ref 0–37)
Albumin: 4.5 g/dL (ref 3.5–5.2)
Alkaline Phosphatase: 96 U/L (ref 39–117)
BUN: 13 mg/dL (ref 6–23)
CO2: 29 mEq/L (ref 19–32)
Calcium: 9 mg/dL (ref 8.4–10.5)
Chloride: 102 mEq/L (ref 96–112)
Creatinine, Ser: 0.88 mg/dL (ref 0.40–1.50)
GFR: 111.11 mL/min (ref >60.00)
Glucose, Bld: 80 mg/dL (ref 70–99)
Potassium: 4.1 mEq/L (ref 3.5–5.1)
Sodium: 137 mEq/L (ref 135–145)
Total Bilirubin: 0.6 mg/dL (ref 0.2–1.2)
Total Protein: 7.2 g/dL (ref 6.0–8.3)

## 2020-04-07 LAB — CBC WITH DIFFERENTIAL/PLATELET
Basophils Absolute: 0 10*3/uL (ref 0.0–0.1)
Basophils Relative: 0.6 % (ref 0.0–3.0)
Eosinophils Absolute: 0.1 10*3/uL (ref 0.0–0.7)
Eosinophils Relative: 1.6 % (ref 0.0–5.0)
HCT: 39.6 % (ref 39.0–52.0)
Hemoglobin: 13.3 g/dL (ref 13.0–17.0)
Lymphocytes Relative: 40.7 % (ref 12.0–46.0)
Lymphs Abs: 1.5 10*3/uL (ref 0.7–4.0)
MCHC: 33.7 g/dL (ref 30.0–36.0)
MCV: 94.9 fl (ref 78.0–100.0)
Monocytes Absolute: 0.3 10*3/uL (ref 0.1–1.0)
Monocytes Relative: 9 % (ref 3.0–12.0)
Neutro Abs: 1.8 10*3/uL (ref 1.4–7.7)
Neutrophils Relative %: 48.1 % (ref 43.0–77.0)
Platelets: 288 10*3/uL (ref 150.0–400.0)
RBC: 4.17 Mil/uL — ABNORMAL LOW (ref 4.22–5.81)
RDW: 14 % (ref 11.5–15.5)
WBC: 3.8 10*3/uL — ABNORMAL LOW (ref 4.0–10.5)

## 2020-04-07 LAB — TSH: TSH: 0.99 u[IU]/mL (ref 0.35–4.50)

## 2020-04-07 LAB — LIPID PANEL
Cholesterol: 222 mg/dL — ABNORMAL HIGH (ref 0–200)
HDL: 74.1 mg/dL (ref >39.00)
LDL Cholesterol: 133 mg/dL — ABNORMAL HIGH (ref 0–99)
NonHDL: 147.55
Total CHOL/HDL Ratio: 3
Triglycerides: 74 mg/dL (ref 0.0–149.0)
VLDL: 14.8 mg/dL (ref 0.0–40.0)

## 2020-04-07 LAB — PSA: PSA: 0.5 ng/mL (ref 0.10–4.00)

## 2020-04-07 LAB — HEMOGLOBIN A1C: Hgb A1c MFr Bld: 5.1 % (ref 4.6–6.5)

## 2020-04-07 MED ORDER — NALTREXONE-BUPROPION HCL ER 8-90 MG PO TB12: ORAL_TABLET | ORAL | 0 refills | Status: DC

## 2020-04-07 NOTE — Progress Notes (Signed)
Subjective:    Patient ID: Jesus Allen, male    DOB: [birth year], 49 y.o.   MRN: [id number]  HPI Patient presents for yearly preventative medicine examination. He is a pleasant 49 year old male who  has a past medical history of ED (erectile dysfunction) and Obesity.   His last CPE was in 01/2017.   Hyperlipidemia - not currently prescribed any medications  Lab Results  Component Value Date   CHOL 208 (H) 02/08/2017   HDL 52.10 02/08/2017   LDLCALC 127 (H) 02/08/2017   TRIG 148.0 02/08/2017   CHOLHDL 4 02/08/2017   Obesity - Back in 2018 he was placed on Contrave. He has been actively working on diet and exercise. Has cut out carbonated beverages and alcohol and has been walking. He would like to try going back on Contrave   All immunizations and health maintenance protocols were reviewed with the patient and needed orders were placed.  Appropriate screening laboratory values were ordered for the patient including screening of hyperlipidemia, renal function and hepatic function.  Medication reconciliation,  past medical history, social history, problem list and allergies were reviewed in detail with the patient  Goals were established with regard to weight loss, exercise, and  diet in compliance with medications. He has been actively walking   Wt Readings from Last 3 Encounters:  04/07/20 267 lb (121.1 kg)  04/02/20 252 lb (114.3 kg)  03/09/20 258 lb (117 kg)   Review of Systems  Constitutional: Negative.   HENT: Negative.   Eyes: Negative.   Respiratory: Negative.   Cardiovascular: Negative.   Gastrointestinal: Negative.   Endocrine: Negative.   Genitourinary: Negative.   Musculoskeletal: Negative.   Skin: Negative.   Allergic/Immunologic: Negative.   Neurological: Negative.   Hematological: Negative.   Psychiatric/Behavioral: Negative.   All other systems reviewed and are negative.  Past Medical History:  Diagnosis Date  . ED (erectile  dysfunction)   . Obesity     Social History   Socioeconomic History  . Marital status: Married    Spouse name: Not on file  . Number of children: Not on file  . Years of education: Not on file  . Highest education level: Not on file  Occupational History  . Not on file  Tobacco Use  . Smoking status: Never Smoker  . Smokeless tobacco: Never Used  Substance and Sexual Activity  . Alcohol use: Yes    Comment: "social"  . Drug use: No  . Sexual activity: Not on file  Other Topics Concern  . Not on file  Social History Narrative   Works as Engineer, building services    Has his MBA   Married    Two children ( 13 and 27)    Social Determinants of Radio broadcast assistant Strain:   . Difficulty of Paying Living Expenses:   Food Insecurity:   . Worried About Charity fundraiser in the Last Year:   . Arboriculturist in the Last Year:   Transportation Needs:   . Film/video editor (Medical):   Marland Kitchen Lack of Transportation (Non-Medical):   Physical Activity:   . Days of Exercise per Week:   . Minutes of Exercise per Session:   Stress:   . Feeling of Stress :   Social Connections:   . Frequency of Communication with Friends and Family:   . Frequency of Social Gatherings with Friends and Family:   . Attends Religious Services:   . Active  Member of Clubs or Organizations:   . Attends Archivist Meetings:   Marland Kitchen Marital Status:   Intimate Partner Violence:   . Fear of Current or Ex-Partner:   . Emotionally Abused:   Marland Kitchen Physically Abused:   . Sexually Abused:     Past Surgical History:  Procedure Laterality Date  . FOOT FRACTURE SURGERY Left     Family History  Problem Relation Age of Onset  . Ovarian cancer Mother   . Stroke Maternal Grandmother   . Diabetes Father   . Blindness Father     No Known Allergies  Current Outpatient Medications on File Prior to Visit  Medication Sig Dispense Refill  . ketoconazole (NIZORAL) 2 % cream Apply 1 application topically  in the morning and at bedtime.    Marland Kitchen ketoconazole (NIZORAL) 2 % shampoo Apply 1 application topically 3 (three) times a week.    . sildenafil (VIAGRA) 100 MG tablet Take 100 mg by mouth daily as needed for erectile dysfunction.     No current facility-administered medications on file prior to visit.    BP (!) 130/94   Temp (!) 97.5 F (36.4 C)   Ht 5\' 10"  (1.778 m)   Wt 267 lb (121.1 kg)   BMI 38.31 kg/m       Objective:   Physical Exam Vitals and nursing note reviewed.  Constitutional:      General: He is not in acute distress.    Appearance: Normal appearance. He is well-developed. He is obese.  HENT:     Head: Normocephalic and atraumatic.     Right Ear: Tympanic membrane, ear canal and external ear normal. There is no impacted cerumen.     Left Ear: Tympanic membrane, ear canal and external ear normal. There is no impacted cerumen.     Nose: Nose normal. No congestion or rhinorrhea.     Mouth/Throat:     Mouth: Mucous membranes are moist.     Pharynx: Oropharynx is clear. No oropharyngeal exudate or posterior oropharyngeal erythema.  Eyes:     General:        Right eye: No discharge.        Left eye: No discharge.     Extraocular Movements: Extraocular movements intact.     Conjunctiva/sclera: Conjunctivae normal.     Pupils: Pupils are equal, round, and reactive to light.  Neck:     Vascular: No carotid bruit.     Trachea: No tracheal deviation.  Cardiovascular:     Rate and Rhythm: Normal rate and regular rhythm.     Pulses: Normal pulses.     Heart sounds: Normal heart sounds. No murmur. No friction rub. No gallop.   Pulmonary:     Effort: Pulmonary effort is normal. No respiratory distress.     Breath sounds: Normal breath sounds. No stridor. No wheezing, rhonchi or rales.  Chest:     Chest wall: No tenderness.  Abdominal:     General: Bowel sounds are normal. There is no distension.     Palpations: Abdomen is soft. There is no mass.     Tenderness: There  is no abdominal tenderness. There is no right CVA tenderness, left CVA tenderness, guarding or rebound.     Hernia: No hernia is present.  Musculoskeletal:        General: No swelling, tenderness, deformity or signs of injury. Normal range of motion.     Right lower leg: No edema.     Left lower leg:  No edema.  Lymphadenopathy:     Cervical: No cervical adenopathy.  Skin:    General: Skin is warm and dry.     Capillary Refill: Capillary refill takes less than 2 seconds.     Coloration: Skin is not jaundiced or pale.     Findings: No bruising, erythema, lesion or rash.  Neurological:     General: No focal deficit present.     Mental Status: He is alert and oriented to person, place, and time.     Cranial Nerves: No cranial nerve deficit.     Sensory: No sensory deficit.     Motor: No weakness.     Coordination: Coordination normal.     Gait: Gait normal.     Deep Tendon Reflexes: Reflexes normal.  Psychiatric:        Mood and Affect: Mood normal.        Behavior: Behavior normal.        Thought Content: Thought content normal.        Judgment: Judgment normal.       Assessment & Plan:  1. Routine general medical examination at a health care facility - CBC with Differential/Platelet - Comprehensive metabolic panel - Lipid panel - TSH - Hemoglobin A1c  2. Other obesity - Will put him back on Contrave.  - Encouraged to continue to exercise and eat healthy  - Follow up in one month  - Naltrexone-buPROPion HCl ER 8-90 MG TB12; Start 1 tablet every morning for 7 days, then 1 tablet twice daily for 7 days, then 2 tablets every morning and one in the evening  Dispense: 120 tablet; Refill: 0 - CBC with Differential/Platelet - Comprehensive metabolic panel - Lipid panel - TSH - Hemoglobin A1c  3. Mixed hyperlipidemia - Consider statin  - CBC with Differential/Platelet - Comprehensive metabolic panel - Lipid panel - TSH - Hemoglobin A1c  4. Prostate cancer screening  -  PSA  Dorothyann Peng, NP

## 2020-04-07 NOTE — Patient Instructions (Signed)
It was great seeing you today   We will follow up with you regarding your blood work   Please follow up in one month

## 2020-04-21 ENCOUNTER — Encounter: Payer: BC Managed Care – PPO | Admitting: Adult Health

## 2020-04-22 ENCOUNTER — Encounter: Payer: Self-pay | Admitting: Physical Therapy

## 2020-04-22 ENCOUNTER — Other Ambulatory Visit: Payer: Self-pay

## 2020-04-22 ENCOUNTER — Ambulatory Visit: Payer: BC Managed Care – PPO | Admitting: Physical Therapy

## 2020-04-22 ENCOUNTER — Ambulatory Visit: Payer: BC Managed Care – PPO | Attending: Adult Health | Admitting: Physical Therapy

## 2020-04-22 DIAGNOSIS — M25552 Pain in left hip: Secondary | ICD-10-CM | POA: Diagnosis not present

## 2020-04-22 DIAGNOSIS — M6281 Muscle weakness (generalized): Secondary | ICD-10-CM | POA: Diagnosis not present

## 2020-04-22 DIAGNOSIS — M25652 Stiffness of left hip, not elsewhere classified: Secondary | ICD-10-CM | POA: Insufficient documentation

## 2020-04-22 NOTE — Patient Instructions (Signed)
Access Code: [facility] URL: [email or web address] Date: 04/22/2020 Prepared by: Earlie Counts  Exercises Supine Single Knee to Chest Stretch - 2 x daily - 7 x weekly - 1 sets - 2 reps Seated Hip Flexor Stretch - 2 x daily - 7 x weekly - 1 sets - 2 reps - 30 sec hold Seated Hip Adductor Stretch - 2 x daily - 7 x weekly - 1 sets - 2 reps - 30 sec hold [facility] [address], [place], [place] [address] Phone # [phone] Fax [phone]

## 2020-04-22 NOTE — Therapy (Signed)
[facility] [address], [place], [place], [address] Phone: [phone]   Fax:  [phone]  Physical Therapy Evaluation  Patient Details  Name: Jesus Allen MRN: [id number] Date of Birth: 01/18/1971 Referring Provider (PT):  Dorothyann Peng NP   Encounter Date: 04/22/2020   PT End of Session - 04/22/20 1537    Visit Number 1    Date for PT Re-Evaluation 06/17/20    Authorization Type BCBS    PT Start Time 1025    PT Stop Time 1526    PT Time Calculation (min) 41 min    Activity Tolerance No increased pain;Patient tolerated treatment well    Behavior During Therapy [facility] for tasks assessed/performed           Past Medical History:  Diagnosis Date  . ED (erectile dysfunction)   . Obesity     Past Surgical History:  Procedure Laterality Date  . FOOT FRACTURE SURGERY Left     There were no vitals filed for this visit.    Subjective Assessment - 04/22/20 1450    Subjective Patient was working out and a pop in the left groin 6 months ago. As it got worse he was not able to bend forward to put his shoe on.    Patient Stated Goals reduce pain and improve ROM    Currently in Pain? Yes    Pain Score 10-Worst pain ever    Pain Location Hip    Pain Orientation Left    Pain Descriptors / Indicators Sharp;Radiating    Pain Type Acute pain    Pain Onset Other (comment)    Pain Frequency Intermittent    Aggravating Factors  flex left hip upward, stand and put shoe on with left foot forward, wake up in the morning,    Pain Relieving Factors sit down, get a sock assist    Multiple Pain Sites No              [facility] PT Assessment - 04/22/20 0001      Assessment   Medical Diagnosis S76.302A Hamstring injury, left, initial encounter    Referring Provider (PT)  Dorothyann Peng NP    Onset Date/Surgical Date 10/23/19    Prior Therapy none      Precautions   Precautions None      Restrictions    Weight Bearing Restrictions No      Balance Screen   Has the patient fallen in the past 6 months No    Has the patient had a decrease in activity level because of a fear of falling?  No    Is the patient reluctant to leave their home because of a fear of falling?  No      Home Ecologist residence      Prior Function   Level of Independence Independent    Vocation Full time employment    Vocation Requirements walking    Leisure getting back into [place]   Overall Cognitive Status Within Functional Limits for tasks assessed      Observation/Other Assessments   Focus on Therapeutic Outcomes (FOTO)  35% limitation; goal is 23% limitation      Posture/Postural Control   Posture/Postural Control No significant limitations      ROM / Strength   AROM / PROM / Strength AROM;PROM;Strength      AROM   Lumbar Flexion decreased by 25% with pulling in  left groin    Lumbar Extension decreased by 50%    Lumbar - Right Rotation decreased by 25% with pulling in left groin      PROM   Right Hip Flexion 95    Right Hip External Rotation  30    Left Hip Extension -10    Left Hip Flexion 80    Left Hip External Rotation  30      Strength   Right Hip Flexion 3/5    Left Hip Flexion 3/5                      Objective measurements completed on examination: See above findings.       [facility] PT Treatment/Exercise - 04/22/20 0001      Knee/Hip Exercises: Stretches   Hip Flexor Stretch Right;Left;1 rep;30 seconds    Hip Flexor Stretch Limitations sitting    Other Knee/Hip Stretches single knee to chest hold 30 sec right, left    Other Knee/Hip Stretches sitting hip adduction       Manual Therapy   Manual Therapy Joint mobilization;Passive ROM    Joint Mobilization T8-L5 with gapping on the left side    Passive ROM to left hip for flexion to stretch post. hip                  PT Education - 04/22/20 1543     Education Details Access Code: [facility]    Person(s) Educated Patient    Methods Explanation;Demonstration;Verbal cues;Handout    Comprehension Returned demonstration;Verbalized understanding            PT Short Term Goals - 04/22/20 1633      PT SHORT TERM GOAL #1   Title independent with initial HEP    Time 4    Period Weeks    Status New    Target Date 05/20/20             PT Long Term Goals - 04/22/20 1634      PT LONG TERM GOAL #1   Title independent with advanced HEP    Time 8    Period Weeks    Status New    Target Date 06/17/20      PT LONG TERM GOAL #2   Title increased left hip flexion >/= 105 degrees so he is able to sit on the commode and not have to pull himself up    Time 8    Period Weeks    Status New    Target Date 06/17/20      PT LONG TERM GOAL #3   Title able to flex his spine and left hip so he is able to put his shoe and sock on daily without difficulty and no device to assist.    Time 8    Period Weeks    Status New    Target Date 06/17/20      PT LONG TERM GOAL #4   Title able to return to his past exercise program due to increased left hip ROM and decreased pain >/= 75%    Time 8    Period Weeks    Status New    Target Date 06/17/20      PT LONG TERM GOAL #5   Title able to get up from a low chair with no difficulty due to left hip strength >/= 4+/5    Time 8    Period Weeks    Status New  Target Date 06/17/20                  Plan - 04/22/20 1543    Clinical Impression Statement Patient is a 49 year old male with left groin pain at level 10/10 intermittently. Patient reports 6 months ago he was working out and felt a pop in left groin. Since then he has adjusted his activities to avoid left hip flexion. Left hip ROM is limited by 50%. Lumbar extension decreased by 50%. Lumbar right rotation is decreased by 25% and causes left groin pain. Lumbar flexion causes a stretch in left hip. Left hip strength averages 3-4/10.  Patient ambulates with decreased hip extension and lumbar extension. After therapy patient had less pain. Patient will benefit from skilled therapy to reduce left groin pain and improve lumbar and left hip mobility.    Personal Factors and Comorbidities Fitness    Examination-Activity Limitations Dressing;Toileting;Sit;Bed Mobility    Examination-Participation Restrictions Community Activity    Stability/Clinical Decision Making Evolving/Moderate complexity    Clinical Decision Making Low    Rehab Potential Excellent    PT Frequency 2x / week    PT Duration 8 weeks    PT Treatment/Interventions Cryotherapy;Electrical Stimulation;Iontophoresis 4mg /ml Dexamethasone;Moist Heat;Ultrasound;Neuromuscular re-education;Therapeutic exercise;Therapeutic activities;Gait training;Patient/family education;Manual techniques;Dry needling;Taping;Joint Manipulations;Spinal Manipulations    PT Next Visit Plan left hip joint mobilization; nustep, back stretches, activities to increase left hip flexion and lumbar flexion    PT Home Exercise Plan Access Code: [email or web address]    Consulted and Agree with Plan of Care Patient           Patient will benefit from skilled therapeutic intervention in order to improve the following deficits and impairments:  Abnormal gait, Decreased range of motion, Increased fascial restricitons, Pain, Decreased activity tolerance, Decreased strength, Decreased mobility  Visit Diagnosis: Muscle weakness (generalized) - Plan: PT plan of care cert/re-cert  Pain in left hip - Plan: PT plan of care cert/re-cert  Stiffness of left hip, not elsewhere classified - Plan: PT plan of care cert/re-cert     Problem List There are no problems to display for this patient.   Earlie Counts, PT 04/22/20 4:39 PM   [facility] [address] [place], [place], [address] Phone: [phone]   Fax:  [phone]  Name: Jesus Allen MRN: [id number] Date of Birth: Dec 30, 1970

## 2020-04-27 ENCOUNTER — Encounter: Payer: Self-pay | Admitting: Physical Therapy

## 2020-04-27 ENCOUNTER — Ambulatory Visit: Payer: BC Managed Care – PPO | Admitting: Physical Therapy

## 2020-04-27 ENCOUNTER — Other Ambulatory Visit: Payer: Self-pay

## 2020-04-27 DIAGNOSIS — M25552 Pain in left hip: Secondary | ICD-10-CM | POA: Diagnosis not present

## 2020-04-27 DIAGNOSIS — M6281 Muscle weakness (generalized): Secondary | ICD-10-CM | POA: Diagnosis not present

## 2020-04-27 DIAGNOSIS — M25652 Stiffness of left hip, not elsewhere classified: Secondary | ICD-10-CM

## 2020-04-27 NOTE — Therapy (Addendum)
[facility] [address] [place], [place], [address] Phone: [phone]   Fax:  [phone]  Physical Therapy Treatment  Patient Details  Name: Jesus Allen MRN: [id number] Date of Birth: 1971/01/13 Referring Provider (PT):  Dorothyann Peng NP   Encounter Date: 04/27/2020   PT End of Session - 04/27/20 1104    Visit Number 2    Date for PT Re-Evaluation 06/17/20    Authorization Type BCBS    PT Start Time 1104    PT Stop Time 1150    PT Time Calculation (min) 46 min    Activity Tolerance No increased pain;Patient tolerated treatment well    Behavior During Therapy [facility] for tasks assessed/performed           Past Medical History:  Diagnosis Date  . ED (erectile dysfunction)   . Obesity     Past Surgical History:  Procedure Laterality Date  . FOOT FRACTURE SURGERY Left     There were no vitals filed for this visit.   Subjective Assessment - 04/27/20 1104    Subjective Patient doing his stretches. No pain since not putting on socks    Patient Stated Goals reduce pain and improve ROM    Currently in Pain? No/denies                             [facility] Adult PT Treatment/Exercise - 04/27/20 0001      Exercises   Exercises Knee/Hip      Knee/Hip Exercises: Stretches   Quad Stretch Both;1 rep;60 seconds    Quad Stretch Limitations prone    Hip Flexor Stretch Right;Left;30 seconds;2 reps    Hip Flexor Stretch Limitations Standing; added overhead reach and SB as alternatives    Piriformis Stretch Left;1 rep;30 seconds    Piriformis Stretch Limitations modified pigeon on table    Other Knee/Hip Stretches single knee to chest hold 30 sec right, left    Other Knee/Hip Stretches standing hip ABD x 10 on left then prolonged hold; frog stretch x 10 then with UE rotation x 5 ea; quad sit back to Rt  and Lt x 5 ea; cat/cow x 10      Knee/Hip Exercises: Aerobic   Stationary Bike L4 X  5 min      Manual Therapy   Manual Therapy Joint mobilization    Joint Mobilization left hip mobs ant; distraction and long leg traction with increased ER after    Passive ROM into left hip flex, ER and IR                  PT Education - 04/27/20 1406    Education Details HEP progressed    Person(s) Educated Patient    Methods Explanation;Demonstration;Handout    Comprehension Verbalized understanding;Returned demonstration            PT Short Term Goals - 04/22/20 1633      PT SHORT TERM GOAL #1   Title independent with initial HEP    Time 4    Period Weeks    Status New    Target Date 05/20/20             PT Long Term Goals - 04/22/20 1634      PT LONG TERM GOAL #1   Title independent with advanced HEP    Time 8    Period Weeks    Status New  Target Date 06/17/20      PT LONG TERM GOAL #2   Title increased left hip flexion >/= 105 degrees so he is able to sit on the commode and not have to pull himself up    Time 8    Period Weeks    Status New    Target Date 06/17/20      PT LONG TERM GOAL #3   Title able to flex his spine and left hip so he is able to put his shoe and sock on daily without difficulty and no device to assist.    Time 8    Period Weeks    Status New    Target Date 06/17/20      PT LONG TERM GOAL #4   Title able to return to his past exercise program due to increased left hip ROM and decreased pain >/= 75%    Time 8    Period Weeks    Status New    Target Date 06/17/20      PT LONG TERM GOAL #5   Title able to get up from a low chair with no difficulty due to left hip strength >/= 4+/5    Time 8    Period Weeks    Status New    Target Date 06/17/20                 Plan - 04/27/20 1407    Clinical Impression Statement Patient tolerated TE well today. He enjoyed the bike as a warm up and was able to tolerate multiple stretches for his hips and back. Small improvement with left hip ER at end of session.    PT  Treatment/Interventions Cryotherapy;Electrical Stimulation;Iontophoresis '4mg'$ /ml Dexamethasone;Moist Heat;Ultrasound;Neuromuscular re-education;Therapeutic exercise;Therapeutic activities;Gait training;Patient/family education;Manual techniques;Dry needling;Taping;Joint Manipulations;Spinal Manipulations    PT Next Visit Plan left hip joint mobilization; nustep, back stretches, activities to increase left hip flexion and lumbar flexion    PT Home Exercise Plan Access Code: [facility]           Patient will benefit from skilled therapeutic intervention in order to improve the following deficits and impairments:  Abnormal gait, Decreased range of motion, Increased fascial restricitons, Pain, Decreased activity tolerance, Decreased strength, Decreased mobility  Visit Diagnosis: Muscle weakness (generalized)  Pain in left hip  Stiffness of left hip, not elsewhere classified     Problem List There are no problems to display for this patient.   Jesus Allen PT 04/27/2020, 2:09 PM PHYSICAL THERAPY DISCHARGE SUMMARY  Visits from Start of Care: 2  Current functional level related to goals / functional outcomes: See above for current status.  Pt didn't return to PT after this visit.     Remaining deficits: See above.     Education / Equipment: HEP Plan: Patient agrees to discharge.  Patient goals were not met. Patient is being discharged due to not returning since the last visit.  ?????        Jesus Allen, PT 06/21/20 12:54 PM  [facility] [address], [place], [place], [address] Phone: [phone]   Fax:  [phone]  Name: Jesus Allen MRN: [id number] Date of Birth: May 18, 1971

## 2020-04-27 NOTE — Patient Instructions (Signed)
Access Code: [facility] URL: [email or web address] Date: 04/27/2020 Prepared by: Almyra Free  Exercises Supine Single Knee to Chest Stretch - 2 x daily - 7 x weekly - 1 sets - 2 reps Seated Hip Flexor Stretch - 2 x daily - 7 x weekly - 1 sets - 2 reps - 30 sec hold Seated Hip Adductor Stretch - 2 x daily - 7 x weekly - 1 sets - 2 reps - 30 sec hold Seated Table Piriformis Stretch - 2 x daily - 7 x weekly - 3 reps - 1 sets - 30-60 sec hold Quadruped Adductor Stretch - 1 x daily - 7 x weekly - 1 sets - 5 reps - 5 sec hold

## 2020-04-29 ENCOUNTER — Ambulatory Visit: Payer: BC Managed Care – PPO | Admitting: Physical Therapy

## 2020-05-11 ENCOUNTER — Ambulatory Visit (INDEPENDENT_AMBULATORY_CARE_PROVIDER_SITE_OTHER): Payer: BC Managed Care – PPO | Admitting: Adult Health

## 2020-05-11 ENCOUNTER — Ambulatory Visit: Payer: BC Managed Care – PPO | Admitting: Adult Health

## 2020-05-11 DIAGNOSIS — Z5329 Procedure and treatment not carried out because of patient's decision for other reasons: Secondary | ICD-10-CM

## 2020-05-11 NOTE — Progress Notes (Deleted)
Subjective:    Patient ID: Jesus Allen, male    DOB: Mar 16, 1971, 49 y.o.   MRN: [id number]  HPI 49 year old male who  has a past medical history of ED (erectile dysfunction) and Obesity.  He presents to the office today for one 1 month follow-up regarding obesity management.  When he was last seen in May 2021 he was placed back on Contrave.  Previously in 2018 he was placed on Contrave but its insurance stopped paying for it.  He had been actively working on diet and exercise and had cut out carbonated beverages and alcohol and had been walking.  Since starting Contrave he reports that   Wt Readings from Last 3 Encounters:  04/07/20 267 lb (121.1 kg)  04/02/20 252 lb (114.3 kg)  03/09/20 258 lb (117 kg)    Review of Systems See HPI   Past Medical History:  Diagnosis Date  . ED (erectile dysfunction)   . Obesity     Social History   Socioeconomic History  . Marital status: Married    Spouse name: Not on file  . Number of children: Not on file  . Years of education: Not on file  . Highest education level: Not on file  Occupational History  . Not on file  Tobacco Use  . Smoking status: Never Smoker  . Smokeless tobacco: Never Used  Vaping Use  . Vaping Use: Never used  Substance and Sexual Activity  . Alcohol use: Yes    Comment: "social"  . Drug use: No  . Sexual activity: Not on file  Other Topics Concern  . Not on file  Social History Narrative   Works as Engineer, building services    Has his MBA   Married    Two children ( 62 and 68)    Social Determinants of Radio broadcast assistant Strain:   . Difficulty of Paying Living Expenses:   Food Insecurity:   . Worried About Charity fundraiser in the Last Year:   . Arboriculturist in the Last Year:   Transportation Needs:   . Film/video editor (Medical):   Marland Kitchen Lack of Transportation (Non-Medical):   Physical Activity:   . Days of Exercise per Week:   . Minutes of Exercise per Session:   Stress:    . Feeling of Stress :   Social Connections:   . Frequency of Communication with Friends and Family:   . Frequency of Social Gatherings with Friends and Family:   . Attends Religious Services:   . Active Member of Clubs or Organizations:   . Attends Archivist Meetings:   Marland Kitchen Marital Status:   Intimate Partner Violence:   . Fear of Current or Ex-Partner:   . Emotionally Abused:   Marland Kitchen Physically Abused:   . Sexually Abused:     Past Surgical History:  Procedure Laterality Date  . FOOT FRACTURE SURGERY Left     Family History  Problem Relation Age of Onset  . Ovarian cancer Mother   . Stroke Maternal Grandmother   . Diabetes Father   . Blindness Father     No Known Allergies  Current Outpatient Medications on File Prior to Visit  Medication Sig Dispense Refill  . ketoconazole (NIZORAL) 2 % cream Apply 1 application topically in the morning and at bedtime.    Marland Kitchen ketoconazole (NIZORAL) 2 % shampoo Apply 1 application topically 3 (three) times a week.    . Naltrexone-buPROPion HCl  ER 8-90 MG TB12 Start 1 tablet every morning for 7 days, then 1 tablet twice daily for 7 days, then 2 tablets every morning and one in the evening 120 tablet 0  . sildenafil (VIAGRA) 100 MG tablet Take 100 mg by mouth daily as needed for erectile dysfunction.     No current facility-administered medications on file prior to visit.    There were no vitals taken for this visit.      Objective:   Physical Exam Vitals and nursing note reviewed.  Constitutional:      Appearance: Normal appearance.  Cardiovascular:     Rate and Rhythm: Normal rate and regular rhythm.     Pulses: Normal pulses.     Heart sounds: Normal heart sounds.  Pulmonary:     Effort: Pulmonary effort is normal.     Breath sounds: Normal breath sounds.  Skin:    General: Skin is warm and dry.  Neurological:     General: No focal deficit present.     Mental Status: He is alert and oriented to person, place, and  time.  Psychiatric:        Mood and Affect: Mood normal.        Behavior: Behavior normal.        Thought Content: Thought content normal.        Judgment: Judgment normal.           Assessment & Plan:

## 2020-05-12 NOTE — Progress Notes (Signed)
Patient was No show for his appointment

## 2020-05-19 ENCOUNTER — Encounter: Payer: BC Managed Care – PPO | Admitting: Physical Therapy

## 2020-05-25 ENCOUNTER — Encounter: Payer: BC Managed Care – PPO | Admitting: Physical Therapy

## 2020-05-28 ENCOUNTER — Encounter: Payer: BC Managed Care – PPO | Admitting: Physical Therapy

## 2020-05-31 ENCOUNTER — Encounter: Payer: BC Managed Care – PPO | Admitting: Physical Therapy

## 2020-06-02 ENCOUNTER — Encounter: Payer: BC Managed Care – PPO | Admitting: Physical Therapy

## 2020-06-07 ENCOUNTER — Encounter: Payer: BC Managed Care – PPO | Admitting: Physical Therapy

## 2020-06-09 ENCOUNTER — Encounter: Payer: BC Managed Care – PPO | Admitting: Physical Therapy

## 2021-03-20 DIAGNOSIS — Z03818 Encounter for observation for suspected exposure to other biological agents ruled out: Secondary | ICD-10-CM | POA: Diagnosis not present

## 2021-03-31 ENCOUNTER — Ambulatory Visit: Payer: BC Managed Care – PPO | Admitting: Adult Health

## 2021-04-07 ENCOUNTER — Ambulatory Visit: Payer: BC Managed Care – PPO | Admitting: Adult Health

## 2021-04-11 DIAGNOSIS — J189 Pneumonia, unspecified organism: Secondary | ICD-10-CM | POA: Diagnosis not present

## 2021-04-12 ENCOUNTER — Other Ambulatory Visit: Payer: Self-pay

## 2021-04-12 ENCOUNTER — Encounter: Payer: Self-pay | Admitting: Adult Health

## 2021-04-12 ENCOUNTER — Ambulatory Visit (INDEPENDENT_AMBULATORY_CARE_PROVIDER_SITE_OTHER): Payer: BC Managed Care – PPO | Admitting: Adult Health

## 2021-04-12 VITALS — BP 138/90 | HR 99 | Temp 98.4°F | Ht 69.75 in | Wt 265.0 lb

## 2021-04-12 DIAGNOSIS — Z125 Encounter for screening for malignant neoplasm of prostate: Secondary | ICD-10-CM | POA: Diagnosis not present

## 2021-04-12 DIAGNOSIS — E668 Other obesity: Secondary | ICD-10-CM

## 2021-04-12 DIAGNOSIS — Z0000 Encounter for general adult medical examination without abnormal findings: Secondary | ICD-10-CM | POA: Diagnosis not present

## 2021-04-12 DIAGNOSIS — J189 Pneumonia, unspecified organism: Secondary | ICD-10-CM

## 2021-04-12 DIAGNOSIS — E782 Mixed hyperlipidemia: Secondary | ICD-10-CM | POA: Diagnosis not present

## 2021-04-12 DIAGNOSIS — Z1159 Encounter for screening for other viral diseases: Secondary | ICD-10-CM

## 2021-04-12 DIAGNOSIS — Z1211 Encounter for screening for malignant neoplasm of colon: Secondary | ICD-10-CM

## 2021-04-12 LAB — CBC WITH DIFFERENTIAL/PLATELET
Basophils Absolute: 0 10*3/uL (ref 0.0–0.1)
Basophils Relative: 0.1 % (ref 0.0–3.0)
Eosinophils Absolute: 0 10*3/uL (ref 0.0–0.7)
Eosinophils Relative: 0.1 % (ref 0.0–5.0)
HCT: 42.8 % (ref 39.0–52.0)
Hemoglobin: 14.5 g/dL (ref 13.0–17.0)
Lymphocytes Relative: 11.6 % — ABNORMAL LOW (ref 12.0–46.0)
Lymphs Abs: 0.9 10*3/uL (ref 0.7–4.0)
MCHC: 34 g/dL (ref 30.0–36.0)
MCV: 94.3 fl (ref 78.0–100.0)
Monocytes Absolute: 0.6 10*3/uL (ref 0.1–1.0)
Monocytes Relative: 8.3 % (ref 3.0–12.0)
Neutro Abs: 6 10*3/uL (ref 1.4–7.7)
Neutrophils Relative %: 79.9 % — ABNORMAL HIGH (ref 43.0–77.0)
Platelets: 307 10*3/uL (ref 150.0–400.0)
RBC: 4.53 Mil/uL (ref 4.22–5.81)
RDW: 14.6 % (ref 11.5–15.5)
WBC: 7.5 10*3/uL (ref 4.0–10.5)

## 2021-04-12 LAB — LIPID PANEL
Cholesterol: 229 mg/dL — ABNORMAL HIGH (ref 0–200)
HDL: 80.5 mg/dL (ref >39.00)
LDL Cholesterol: 134 mg/dL — ABNORMAL HIGH (ref 0–99)
NonHDL: 148.62
Total CHOL/HDL Ratio: 3
Triglycerides: 74 mg/dL (ref 0.0–149.0)
VLDL: 14.8 mg/dL (ref 0.0–40.0)

## 2021-04-12 LAB — TSH: TSH: 1.48 u[IU]/mL (ref 0.35–4.50)

## 2021-04-12 LAB — HEMOGLOBIN A1C: Hgb A1c MFr Bld: 5.9 % (ref 4.6–6.5)

## 2021-04-12 LAB — PSA: PSA: 0.49 ng/mL (ref 0.10–4.00)

## 2021-04-12 NOTE — Patient Instructions (Signed)
It was great seeing you today!  The antibiotics you were placed on should clear up your pneumonia in a few days. Let me know if you develop fevers, chills, or just start feeling bad   We will follow up with you regarding your blood work

## 2021-04-12 NOTE — Progress Notes (Signed)
Subjective:    Patient ID: Jesus Allen, male    DOB: 1971/10/19, 50 y.o.   MRN: [id number]  HPI Patient presents for yearly preventative medicine examination. He is a pleasant 50 year old male who  has a past medical history of ED (erectile dysfunction) and Obesity.  Hyperlipidemia - not currently on medications  Lab Results  Component Value Date   CHOL 222 (H) 04/07/2020   HDL 74.10 04/07/2020   LDLCALC 133 (H) 04/07/2020   TRIG 74.0 04/07/2020   CHOLHDL 3 04/07/2020   Obesity -been on Contrave in the past, never pleated follow-up appointments for more medication. Wt Readings from Last 3 Encounters:  04/12/21 265 lb (120.2 kg)  04/07/20 267 lb (121.1 kg)  04/02/20 252 lb (114.3 kg)   Pneumonia - was seen at [facility] yesterday and his xray showed pneumonia. Only symptoms he was experiencing has been pain/tightness  under his right breast. Was prescribed Azithromycin and Prednisone.   All immunizations and health maintenance protocols were reviewed with the patient and needed orders were placed.  Appropriate screening laboratory values were ordered for the patient including screening of hyperlipidemia, renal function and hepatic function. If indicated by BPH, a PSA was ordered.  Medication reconciliation,  past medical history, social history, problem list and allergies were reviewed in detail with the patient  Goals were established with regard to weight loss, exercise, and  diet in compliance with medications   Review of Systems  Constitutional: Negative.   HENT: Negative.   Eyes: Negative.   Respiratory: Negative.   Cardiovascular: Negative.   Gastrointestinal: Negative.   Endocrine: Negative.   Genitourinary: Negative.   Musculoskeletal: Negative.   Skin: Negative.   Allergic/Immunologic: Negative.   Neurological: Negative.   Hematological: Negative.   Psychiatric/Behavioral: Negative.   All other systems reviewed and are negative.  Past Medical  History:  Diagnosis Date  . ED (erectile dysfunction)   . Obesity     Social History   Socioeconomic History  . Marital status: Married    Spouse name: Not on file  . Number of children: Not on file  . Years of education: Not on file  . Highest education level: Not on file  Occupational History  . Not on file  Tobacco Use  . Smoking status: Never Smoker  . Smokeless tobacco: Never Used  Vaping Use  . Vaping Use: Never used  Substance and Sexual Activity  . Alcohol use: Yes    Comment: "social"  . Drug use: No  . Sexual activity: Not on file  Other Topics Concern  . 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 FOOT FRACTURE SURGERY Left     Family History  Problem Relation Age of Onset  . Ovarian cancer Mother   . Stroke Maternal Grandmother   . Diabetes Father   . Blindness Father     No Known Allergies  Current Outpatient Medications on File Prior to Visit  Medication Sig Dispense Refill  . azithromycin (  ZITHROMAX) 250 MG tablet Take 250 mg by mouth as directed.    Marland Kitchen ketoconazole (NIZORAL) 2 % cream Apply 1 application topically in the morning and at bedtime.    Marland Kitchen ketoconazole (NIZORAL) 2 % shampoo Apply 1 application topically 3 (three) times a week.    . predniSONE (STERAPRED UNI-PAK 21 TAB) 10 MG (21) TBPK tablet Take by mouth.    . sildenafil (VIAGRA) 100 MG tablet Take 100 mg by mouth daily as needed for erectile dysfunction. (Patient not taking: Reported on 04/12/2021)     No current facility-administered medications on file prior to visit.    BP (!)  142/100   Pulse 99   Temp 98.4 F (36.9 C) (Oral)   Ht 5' 9.75" (1.772 m)   Wt 265 lb (120.2 kg)   SpO2 96%   BMI 38.30 kg/m       Objective:   Physical Exam Vitals and nursing note reviewed.  Constitutional:      General: He is not in acute distress.    Appearance: Normal appearance. He is well-developed. He is obese.  HENT:     Head: Normocephalic and atraumatic.     Right Ear: Tympanic membrane, ear canal and external ear normal. There is no impacted cerumen.     Left Ear: Tympanic membrane, ear canal and external ear normal. There is no impacted cerumen.     Nose: Nose normal. No congestion or rhinorrhea.     Mouth/Throat:     Mouth: Mucous membranes are moist.     Pharynx: Oropharynx is clear. No oropharyngeal exudate or posterior oropharyngeal erythema.  Eyes:     General:        Right eye: No discharge.        Left eye: No discharge.     Extraocular Movements: Extraocular movements intact.     Conjunctiva/sclera: Conjunctivae normal.     Pupils: Pupils are equal, round, and reactive to light.  Neck:     Vascular: No carotid bruit.     Trachea: No tracheal deviation.  Cardiovascular:     Rate and Rhythm: Normal rate and regular rhythm.     Pulses: Normal pulses.     Heart sounds: Normal heart sounds. No murmur heard. No friction rub. No gallop.   Pulmonary:     Effort: Pulmonary effort is normal. No respiratory distress.     Breath sounds: No stridor. Examination of the right-lower field reveals decreased breath sounds. Decreased breath sounds present. No wheezing, rhonchi or rales.  Chest:     Chest wall: No tenderness.  Abdominal:     General: Abdomen is flat. Bowel sounds are normal. There is no distension.     Palpations: Abdomen is soft. There is no mass.     Tenderness: There is no abdominal tenderness. There is no right CVA tenderness, left CVA tenderness, guarding or rebound.     Hernia: No hernia is present.  Musculoskeletal:        General: No  swelling, tenderness, deformity or signs of injury. Normal range of motion.     Right lower leg: No edema.     Left lower leg: No edema.  Lymphadenopathy:     Cervical: No cervical adenopathy.  Skin:    General: Skin is warm and dry.     Capillary Refill: Capillary refill takes less than 2 seconds.     Coloration: Skin is not jaundiced or pale.     Findings: No bruising, erythema, lesion or rash.  Neurological:     General: No focal deficit present.     Mental Status: He is alert and oriented to person, place, and time.     Cranial Nerves: No cranial nerve deficit.     Sensory: No sensory deficit.     Motor: No weakness.     Coordination: Coordination normal.     Gait: Gait normal.     Deep Tendon Reflexes: Reflexes normal.  Psychiatric:        Mood and Affect: Mood normal.        Behavior: Behavior normal.        Thought Content: Thought content normal.        Judgment: Judgment normal.       Assessment & Plan:  1. Routine general medical examination at a health care facility - Follow up in one year or sooner if needed  - CBC with Differential/Platelet; Future - Hemoglobin A1c; Future - Lipid panel; Future - TSH; Future  2. Other obesity - Encouraged weight loss through diet and exercise  - Consider Wegovy  - CBC with Differential/Platelet; Future - Hemoglobin A1c; Future - Lipid panel; Future - TSH; Future  3. Mixed hyperlipidemia - Consider statin  - CBC with Differential/Platelet; Future - Hemoglobin A1c; Future - Lipid panel; Future - TSH; Future  4. Prostate cancer screening  - PSA; Future  5. Need for hepatitis C screening test  - Hep C Antibody; Future  6. Colon cancer screening  - Ambulatory referral to Gastroenterology  7. Pneumonia of right lower lobe due to infectious organism - Finished ABX therapy  - Advised of follow up instructions   [employer]

## 2021-04-12 NOTE — Addendum Note (Signed)
Addended by: Denna Haggard K on: 04/12/2021 08:00 AM   Modules accepted: Orders

## 2021-04-12 NOTE — Addendum Note (Signed)
Addended by: Elmer Picker on: 04/12/2021 08:01 AM   Modules accepted: Orders

## 2021-04-13 ENCOUNTER — Other Ambulatory Visit: Payer: Self-pay

## 2021-04-13 ENCOUNTER — Encounter: Payer: Self-pay | Admitting: Adult Health

## 2021-04-13 LAB — HEPATITIS C ANTIBODY
Hepatitis C Ab: NONREACTIVE
SIGNAL TO CUT-OFF: 0.01 (ref <1.00)

## 2021-04-13 MED ORDER — ROSUVASTATIN CALCIUM 5 MG PO TABS: 5.0000 mg | ORAL_TABLET | Freq: Every day | ORAL | 3 refills | Status: DC

## 2021-04-26 ENCOUNTER — Encounter: Payer: Self-pay | Admitting: Gastroenterology

## 2021-05-25 ENCOUNTER — Encounter: Payer: BC Managed Care – PPO | Admitting: Adult Health

## 2021-06-20 ENCOUNTER — Encounter: Payer: Self-pay | Admitting: Adult Health

## 2021-06-21 NOTE — Telephone Encounter (Signed)
Please advise see other message in chart.

## 2021-07-11 ENCOUNTER — Other Ambulatory Visit: Payer: Self-pay

## 2021-07-11 ENCOUNTER — Ambulatory Visit (AMBULATORY_SURGERY_CENTER): Payer: Self-pay | Admitting: *Deleted

## 2021-07-11 VITALS — Ht 69.0 in | Wt 252.0 lb

## 2021-07-11 DIAGNOSIS — Z1211 Encounter for screening for malignant neoplasm of colon: Secondary | ICD-10-CM

## 2021-07-11 MED ORDER — PLENVU 140 G PO SOLR: 1.0000 | Freq: Once | ORAL | 0 refills | Status: AC

## 2021-07-11 NOTE — Progress Notes (Signed)
In-person pre visit completed.    No egg or soy allergy known to patient  No issues with past sedation with any surgeries or procedures Patient denies ever being told they had issues or difficulty with intubation  No FH of Malignant Hyperthermia No diet pills per patient No home 02 use per patient  No blood thinners per patient  Pt denies issues with constipation  No A fib or A flutter  EMMI video to pt or via [place] 19 guidelines implemented in PV today with Pt and RN   Pt is fully vaccinated  for Covid    Coupon given to pt in PV today , Code to Pharmacy and  NO PA's for preps discussed with pt In PV today  Discussed with pt there will be an out-of-pocket cost for prep and that varies from $0 to 70 +  dollars   Due to the COVID-19 pandemic we are asking patients to follow certain guidelines.  Pt aware of COVID protocols and [facility] guidelines

## 2021-07-12 ENCOUNTER — Encounter: Payer: Self-pay | Admitting: Gastroenterology

## 2021-07-25 ENCOUNTER — Ambulatory Visit (AMBULATORY_SURGERY_CENTER): Payer: BC Managed Care – PPO | Admitting: Gastroenterology

## 2021-07-25 ENCOUNTER — Other Ambulatory Visit: Payer: Self-pay

## 2021-07-25 ENCOUNTER — Encounter: Payer: Self-pay | Admitting: Gastroenterology

## 2021-07-25 VITALS — BP 127/86 | HR 82 | Temp 97.5°F | Resp 18 | Ht 69.75 in | Wt 252.0 lb

## 2021-07-25 DIAGNOSIS — D123 Benign neoplasm of transverse colon: Secondary | ICD-10-CM | POA: Diagnosis not present

## 2021-07-25 DIAGNOSIS — Z1211 Encounter for screening for malignant neoplasm of colon: Secondary | ICD-10-CM | POA: Diagnosis not present

## 2021-07-25 MED ORDER — SODIUM CHLORIDE 0.9 % IV SOLN: 500.0000 mL | Freq: Once | INTRAVENOUS | Status: DC

## 2021-07-25 NOTE — Progress Notes (Signed)
History and Physical:  This patient presents for endoscopic testing for: Encounter Diagnosis  Name Primary?   Special screening for malignant neoplasms, colon Yes   Patient without complaints today    Past Medical History: Past Medical History:  Diagnosis Date   ED (erectile dysfunction)    Hyperlipidemia    Obesity      Past Surgical History: Past Surgical History:  Procedure Laterality Date   FOOT FRACTURE SURGERY Left     Allergies: No Known Allergies  Outpatient Meds: Current Outpatient Medications  Medication Sig Dispense Refill   ketoconazole (NIZORAL) 2 % cream Apply 1 application topically in the morning and at bedtime.     ketoconazole (NIZORAL) 2 % shampoo Apply 1 application topically 3 (three) times a week.     rosuvastatin (CRESTOR) 5 MG tablet Take 1 tablet (5 mg total) by mouth daily. 90 tablet 3   sildenafil (VIAGRA) 100 MG tablet Take 100 mg by mouth daily as needed for erectile dysfunction. (Patient not taking: No sig reported)     Current Facility-Administered Medications  Medication Dose Route Frequency Provider Last Rate Last Admin   0.9 %  sodium chloride infusion  500 mL Intravenous Once Danis, Kirke Corin, MD          ___________________________________________________________________ Objective   Exam:  BP (!) 155/82 (BP Location: Right Arm, Patient Position: Sitting, Cuff Size: Normal)   Pulse 82   Temp (!) 97.5 F (36.4 C) (Temporal)   Ht 5' 9.75" (1.772 m)   Wt 252 lb (114.3 kg)   SpO2 99%   BMI 36.42 kg/m   CV: RRR without murmur, S1/S2 Resp: clear to auscultation bilaterally, normal RR and effort noted GI: soft, no tenderness, with active bowel sounds.   Assessment: Encounter Diagnosis  Name Primary?   Special screening for malignant neoplasms, colon Yes     Plan: Colonoscopy  The benefits and risks of the planned procedure were described in detail with the patient or (when appropriate) their health care proxy.   Risks were outlined as including, but not limited to, bleeding, infection, perforation, adverse medication reaction leading to cardiac or pulmonary decompensation, pancreatitis (if ERCP).  The limitation of incomplete mucosal visualization was also discussed.  No guarantees or warranties were given.    The patient is appropriate for an endoscopic procedure in the ambulatory setting.   - Wilfrid Lund, MD

## 2021-07-25 NOTE — Progress Notes (Signed)
Vitals-MO  Pt's states no medical or surgical changes since previsit or office visit.

## 2021-07-25 NOTE — Progress Notes (Signed)
PT taken to PACU. Monitors in place. VSS. Report given to RN. 

## 2021-07-25 NOTE — Progress Notes (Signed)
Called to room to assist during endoscopic procedure.  Patient ID and intended procedure confirmed with present staff. Received instructions for my participation in the procedure from the performing physician.  

## 2021-07-25 NOTE — Patient Instructions (Signed)
Discharge instructions given. ?Handouts on polyps and Hemorrhoids. ?Resume previous medications. ?YOU HAD AN ENDOSCOPIC PROCEDURE TODAY AT THE [facility]:   Refer to the procedure report that was given to you for any specific questions about what was found during the examination.  If the procedure report does not answer your questions, please call your gastroenterologist to clarify.  If you requested that your care partner not be given the details of your procedure findings, then the procedure report has been included in a sealed envelope for you to review at your convenience later. ? ?YOU SHOULD EXPECT: Some feelings of bloating in the abdomen. Passage of more gas than usual.  Walking can help get rid of the air that was put into your GI tract during the procedure and reduce the bloating. If you had a lower endoscopy (such as a colonoscopy or flexible sigmoidoscopy) you may notice spotting of blood in your stool or on the toilet paper. If you underwent a bowel prep for your procedure, you may not have a normal bowel movement for a few days. ? ?Please Note:  You might notice some irritation and congestion in your nose or some drainage.  This is from the oxygen used during your procedure.  There is no need for concern and it should clear up in a day or so. ? ?SYMPTOMS TO REPORT IMMEDIATELY: ? ?Following lower endoscopy (colonoscopy or flexible sigmoidoscopy): ? Excessive amounts of blood in the stool ? Significant tenderness or worsening of abdominal pains ? Swelling of the abdomen that is new, acute ? Fever of 100?F or higher ? ? ?For urgent or emergent issues, a gastroenterologist can be reached at any hour by calling [phone]. ?Do not use MyChart messaging for urgent concerns.  ? ? ?DIET:  We do recommend a small meal at first, but then you may proceed to your regular diet.  Drink plenty of fluids but you should avoid alcoholic beverages for 24 hours. ? ?ACTIVITY:  You should plan to take it  easy for the rest of today and you should NOT DRIVE or use heavy machinery until tomorrow (because of the sedation medicines used during the test).   ? ?FOLLOW UP: ?Our staff will call the number listed on your records 48-72 hours following your procedure to check on you and address any questions or concerns that you may have regarding the information given to you following your procedure. If we do not reach you, we will leave a message.  We will attempt to reach you two times.  During this call, we will ask if you have developed any symptoms of COVID 19. If you develop any symptoms (ie: fever, flu-like symptoms, shortness of breath, cough etc.) before then, please call [phone].  If you test positive for Covid 19 in the 2 weeks post procedure, please call and report this information to us.   ? ?If any biopsies were taken you will be contacted by phone or by letter within the next 1-3 weeks.  Please call us at [phone] if you have not heard about the biopsies in 3 weeks.  ? ? ?SIGNATURES/CONFIDENTIALITY: ?You and/or your care partner have signed paperwork which will be entered into your electronic medical record.  These signatures attest to the fact that that the information above on your After Visit Summary has been reviewed and is understood.  Full responsibility of the confidentiality of this discharge information lies with you and/or your care-partner.  ?

## 2021-07-25 NOTE — Op Note (Signed)
[place] Patient Name: Jesus Allen Procedure Date: 07/25/2021 9:05 AM MRN: [id number] Endoscopist: Mallie Mussel L. Loletha Carrow , MD Age: 50 Referring MD:  Date of Birth: 07/31/1971 Gender: Male Account #: [id number] Procedure:                Colonoscopy Indications:              Screening for colorectal malignant neoplasm, This                            is the patient's first colonoscopy Medicines:                Monitored Anesthesia Care Procedure:                Pre-Anesthesia Assessment:                           - Prior to the procedure, a History and Physical                            was performed, and patient medications and                            allergies were reviewed. The patient's tolerance of                            previous anesthesia was also reviewed. The risks                            and benefits of the procedure and the sedation                            options and risks were discussed with the patient.                            All questions were answered, and informed consent                            was obtained. Prior Anticoagulants: The patient has                            taken no previous anticoagulant or antiplatelet                            agents. ASA Grade Assessment: II - A patient with                            mild systemic disease. After reviewing the risks                            and benefits, the patient was deemed in                            satisfactory condition to undergo the procedure.  After obtaining informed consent, the colonoscope                            was passed under direct vision. Throughout the                            procedure, the patient's blood pressure, pulse, and                            oxygen saturations were monitored continuously. The                            Colonoscope was introduced through the anus and                            advanced to the the  ileocecal valve. The                            colonoscopy was somewhat difficult due to a                            redundant colon. Successful completion of the                            procedure was aided by using manual pressure. The                            patient tolerated the procedure well. The quality                            of the bowel preparation was excellent. The                            ileocecal valve, appendiceal orifice, and rectum                            were photographed. Scope In: 9:14:27 AM Scope Out: 9:32:54 AM Scope Withdrawal Time: 0 hours 10 minutes 43 seconds  Total Procedure Duration: 0 hours 18 minutes 27 seconds  Findings:                 The perianal and digital rectal examinations were                            normal.                           A diminutive polyp was found in the transverse                            colon. The polyp was sessile. The polyp was removed                            with a cold snare. Resection and retrieval were  complete.                           The exam was otherwise without abnormality on                            direct and retroflexion views. Complications:            No immediate complications. Estimated Blood Loss:     Estimated blood loss was minimal. Impression:               - One diminutive polyp in the transverse colon,                            removed with a cold snare. Resected and retrieved.                           - The examination was otherwise normal on direct                            and retroflexion views. Recommendation:           - Patient has a contact number available for                            emergencies. The signs and symptoms of potential                            delayed complications were discussed with the                            patient. Return to normal activities tomorrow.                            Written discharge instructions were  provided to the                            patient.                           - Resume previous diet.                           - Continue present medications.                           - Await pathology results.                           - Repeat colonoscopy is recommended for                            surveillance. The colonoscopy date will be                            determined after pathology results from today's  exam become available for review. Doraine Schexnider L. Loletha Carrow, MD 07/25/2021 9:37:49 AM This report has been signed electronically.

## 2021-07-27 ENCOUNTER — Telehealth: Payer: Self-pay

## 2021-07-27 NOTE — Telephone Encounter (Signed)
Attempted to reach patient for post-procedure f/u call. No answer. Left message that staff will make another attempt to reach him later today and for him to please not hesitate to call us if he has any questions/concerns regarding his care. 

## 2021-07-27 NOTE — Telephone Encounter (Signed)
  Follow up Call-  Call back number 07/25/2021  Post procedure Call Back phone  # [phone]  Permission to leave phone message Yes  Some recent data might be hidden     Patient questions:  Do you have a fever, pain , or abdominal swelling? No. Pain Score  0 *  Have you tolerated food without any problems? Yes.    Have you been able to return to your normal activities? Yes.    Do you have any questions about your discharge instructions: Diet   No. Medications  No. Follow up visit  No.  Do you have questions or concerns about your Care? No.  Actions: * If pain score is 4 or above: No action needed, pain <4. Have you developed a fever since your procedure? no  2.   Have you had an respiratory symptoms (SOB or cough) since your procedure? no  3.   Have you tested positive for COVID 19 since your procedure no  4.   Have you had any family members/close contacts diagnosed with the COVID 19 since your procedure?  no   If yes to any of these questions please route to Joylene John, RN and Joella Prince, RN

## 2021-08-02 ENCOUNTER — Encounter: Payer: Self-pay | Admitting: Gastroenterology

## 2021-08-08 ENCOUNTER — Other Ambulatory Visit: Payer: Self-pay

## 2021-08-09 ENCOUNTER — Ambulatory Visit (INDEPENDENT_AMBULATORY_CARE_PROVIDER_SITE_OTHER): Payer: BC Managed Care – PPO

## 2021-08-09 ENCOUNTER — Encounter: Payer: Self-pay | Admitting: Adult Health

## 2021-08-09 ENCOUNTER — Ambulatory Visit (INDEPENDENT_AMBULATORY_CARE_PROVIDER_SITE_OTHER): Payer: BC Managed Care – PPO | Admitting: Adult Health

## 2021-08-09 VITALS — BP 140/82 | HR 99 | Temp 98.5°F | Ht 69.75 in | Wt 259.0 lb

## 2021-08-09 DIAGNOSIS — E668 Other obesity: Secondary | ICD-10-CM | POA: Diagnosis not present

## 2021-08-09 DIAGNOSIS — R1032 Left lower quadrant pain: Secondary | ICD-10-CM

## 2021-08-09 DIAGNOSIS — M1612 Unilateral primary osteoarthritis, left hip: Secondary | ICD-10-CM | POA: Diagnosis not present

## 2021-08-09 DIAGNOSIS — R1030 Lower abdominal pain, unspecified: Secondary | ICD-10-CM | POA: Diagnosis not present

## 2021-08-09 MED ORDER — TIRZEPATIDE 2.5 MG/0.5ML ~~LOC~~ SOAJ: 2.5000 mg | SUBCUTANEOUS | 0 refills | Status: DC

## 2021-08-09 NOTE — Progress Notes (Signed)
Subjective:    Patient ID: Jesus Allen, male    DOB: [birth year], 50 y.o.   MRN: [id number]  HPI 50 year old male who  has a past medical history of ED (erectile dysfunction), Hyperlipidemia, and Obesity.  He presents to the office today for chronic left groin pain.  He was initially evaluated for this back in April 2020 after his discomfort have been present for roughly 2 to 3 weeks.  First noticed the pain after he had been exercising more and was running.  He went down to get into his bed 1 evening and felt a popping sensation.  He has completed physical therapy in 2021 and has continued to do the exercises that they recommended but just has not had any improvement.  Symptoms do not seem to be getting any worse.  Pain is all located in his left groin upwards to the crest of his left hip.  No buttock pain or pain in his lower back.  Additionally, he would like to discuss further weight loss measures.  In the past has been on Contrave and then we tried to get him on Wegovy but due to national shortage of medication this was not able to happen.  He is wondering if there is anything else we can use to help with weight loss.  Does try and eat healthy and exercise on a routine basis  Wt Readings from Last 3 Encounters:  08/09/21 259 lb (117.5 kg)  07/25/21 252 lb (114.3 kg)  07/11/21 252 lb (114.3 kg)    Review of Systems See HPI   Past Medical History:  Diagnosis Date   ED (erectile dysfunction)    Hyperlipidemia    Obesity     Social History   Socioeconomic History   Marital status: Married    Spouse name: Not on file   Number of children: Not on file   Years of education: Not on file   Highest education level: Not on file  Occupational History   Not on file  Tobacco Use   Smoking status: Never   Smokeless tobacco: Never  Vaping Use   Vaping Use: Never used  Substance and Sexual Activity   Alcohol use: Yes    Comment: "social"   Drug use: No   Sexual  activity: Not on file  Other Topics Concern   Not on file  Social History Narrative   Works as Engineer, building services    Has his MBA   Married    Two children ( 6 and 70)    Social Determinants of Radio broadcast assistant Strain: Not on file  Food Insecurity: Not on file  Transportation Needs: Not on file  Physical Activity: Not on file  Stress: Not on file  Social Connections: Not on file  Intimate Partner Violence: Not on file    Past Surgical History:  Procedure Laterality Date   FOOT FRACTURE SURGERY Left     Family History  Problem Relation Age of Onset   Ovarian cancer Mother    Diabetes Father    Blindness Father    Stroke Maternal Grandmother    Colon cancer Neg Hx    Colon polyps Neg Hx    Esophageal cancer Neg Hx    Stomach cancer Neg Hx    Rectal cancer Neg Hx     No Known Allergies  Current Outpatient Medications on File Prior to Visit  Medication Sig Dispense Refill   ketoconazole (NIZORAL) 2 % cream Apply 1  application topically in the morning and at bedtime.     ketoconazole (NIZORAL) 2 % shampoo Apply 1 application topically 3 (three) times a week.     rosuvastatin (CRESTOR) 5 MG tablet Take 1 tablet (5 mg total) by mouth daily. 90 tablet 3   sildenafil (VIAGRA) 100 MG tablet Take 100 mg by mouth daily as needed for erectile dysfunction.     No current facility-administered medications on file prior to visit.    BP 140/82   Pulse 99   Temp 98.5 F (36.9 C) (Oral)   Ht 5' 9.75" (1.772 m)   Wt 259 lb (117.5 kg)   SpO2 98%   BMI 37.43 kg/m       Objective:   Physical Exam Vitals and nursing note reviewed.  Constitutional:      Appearance: Normal appearance. He is obese.  Cardiovascular:     Rate and Rhythm: Normal rate and regular rhythm.     Pulses: Normal pulses.     Heart sounds: Normal heart sounds.  Pulmonary:     Effort: Pulmonary effort is normal.     Breath sounds: Normal breath sounds.  Musculoskeletal:        General: Normal  range of motion.     Comments: Pain in left  groin with external rotation of left leg.  Skin:    General: Skin is warm and dry.     Capillary Refill: Capillary refill takes less than 2 seconds.  Neurological:     General: No focal deficit present.     Mental Status: He is alert and oriented to person, place, and time.  Psychiatric:        Mood and Affect: Mood normal.        Behavior: Behavior normal.        Thought Content: Thought content normal.        Judgment: Judgment normal.      Assessment & Plan:  1. Left groin pain  - DG Hip Unilat W OR W/O Pelvis 2-3 Views Left; Future - Consider MRI of left hip  2. Other obesity -We discussed options including waiting till [facility] comes back in stock or trying him on Mounjaro off label for weight loss management. He would like to try [facility]. Reviewed side effects. Follow up in one month or sooner if needed - tirzepatide [facility]) 2.5 MG/0.5ML Pen; Inject 2.5 mg into the skin once a week.  Dispense: 2 mL; Refill: 0  Dorothyann Peng, NP

## 2021-08-12 ENCOUNTER — Other Ambulatory Visit: Payer: Self-pay | Admitting: Adult Health

## 2021-08-12 DIAGNOSIS — R1032 Left lower quadrant pain: Secondary | ICD-10-CM

## 2021-09-01 ENCOUNTER — Encounter: Payer: Self-pay | Admitting: Adult Health

## 2021-09-01 ENCOUNTER — Ambulatory Visit
Admission: RE | Admit: 2021-09-01 | Discharge: 2021-09-01 | Disposition: A | Discharge status: Home / Self Care | Payer: BC Managed Care – PPO | Source: Ambulatory Visit | Attending: Adult Health | Admitting: Adult Health

## 2021-09-01 DIAGNOSIS — R600 Localized edema: Secondary | ICD-10-CM | POA: Diagnosis not present

## 2021-09-01 DIAGNOSIS — M25452 Effusion, left hip: Secondary | ICD-10-CM | POA: Diagnosis not present

## 2021-09-01 DIAGNOSIS — R1032 Left lower quadrant pain: Secondary | ICD-10-CM

## 2021-09-01 DIAGNOSIS — M1612 Unilateral primary osteoarthritis, left hip: Secondary | ICD-10-CM | POA: Diagnosis not present

## 2021-09-02 NOTE — Telephone Encounter (Signed)
This has been taking care of. Pt notified of update. 

## 2021-09-06 ENCOUNTER — Telehealth (INDEPENDENT_AMBULATORY_CARE_PROVIDER_SITE_OTHER): Payer: BC Managed Care – PPO | Admitting: Adult Health

## 2021-09-06 ENCOUNTER — Encounter: Payer: Self-pay | Admitting: Adult Health

## 2021-09-06 VITALS — Ht 69.75 in | Wt 255.0 lb

## 2021-09-06 DIAGNOSIS — E668 Other obesity: Secondary | ICD-10-CM | POA: Diagnosis not present

## 2021-09-06 DIAGNOSIS — N3289 Other specified disorders of bladder: Secondary | ICD-10-CM | POA: Diagnosis not present

## 2021-09-06 DIAGNOSIS — R1032 Left lower quadrant pain: Secondary | ICD-10-CM

## 2021-09-06 MED ORDER — TIRZEPATIDE 5 MG/0.5ML ~~LOC~~ SOAJ: 5.0000 mg | SUBCUTANEOUS | 0 refills | Status: DC

## 2021-09-06 NOTE — Telephone Encounter (Signed)
Please advise 

## 2021-09-06 NOTE — Progress Notes (Signed)
Virtual Visit via Video Note  I connected with Jesus Allen on 09/06/21 at  8:30 AM EDT by a video enabled telemedicine application and verified that I am speaking with the correct person using two identifiers.  Location patient: home Location provider:work or home office Persons participating in the virtual visit: patient, provider  I discussed the limitations of evaluation and management by telemedicine and the availability of in person appointments. The patient expressed understanding and agreed to proceed.   HPI:  He presents to the office today for follow-up regarding left-sided groin pain and weight loss management.  Left Sided Groin Pain -this is a chronic issue that has been present since back in April 2020.  He completed physical therapy in 2021 and has continued to do the exercises that they recommended but has not noticed any improvement.  Symptoms do not seem to be getting any worse.  Pain is located in his left groin upwards to the crest of the left hip.  He denies any buttock pain or low back pain.   His x-ray in September 2022 showed moderate bilateral hip osteoarthritis  His MRI on 09/01/2021 showed:   IMPRESSION: 1. Age advanced osteoarthritis of both hips, worse on the left. Associated labral degeneration without significant paralabral cyst. 2. No evidence of acute fracture or femoral head avascular necrosis. 3. 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 4. The hip muscles and tendons appear unremarkable. 5. labral degeneration which may relate to chronic bladder outlet obstruction. Correlate clinically.  Weight loss management -in the past he was on Contrave and then was trialed on Wegovy but due to national shortage of medication he was not able to stay on Wegovy.  During his last visit we started him on him on Mounjaro 2.5 mg.  Since starting him on [place] he has not had any side effects and feels as though he is tolerating the medication  well.  Wt Readings from Last 3 Encounters:  09/06/21 255 lb (115.7 kg)  08/09/21 259 lb (117.5 kg)  07/25/21 252 lb (114.3 kg)   ROS: See pertinent positives and negatives per HPI.  Past Medical History:  Diagnosis Date   ED (erectile dysfunction)    Hyperlipidemia    Obesity     Past Surgical History:  Procedure Laterality Date   FOOT FRACTURE SURGERY Left     Family History  Problem Relation Age of Onset   Ovarian cancer Mother    Diabetes Father    Blindness Father    Stroke Maternal Grandmother    Colon cancer Neg Hx    Colon polyps Neg Hx    Esophageal cancer Neg Hx    Stomach cancer Neg Hx    Rectal cancer Neg Hx        Current Outpatient Medications:    ketoconazole (NIZORAL) 2 % cream, Apply 1 application topically in the morning and at bedtime., Disp: , Rfl:    ketoconazole (NIZORAL) 2 % shampoo, Apply 1 application topically 3 (three) times a week., Disp: , Rfl:    rosuvastatin (CRESTOR) 5 MG tablet, Take 1 tablet (5 mg total) by mouth daily., Disp: 90 tablet, Rfl: 3   sildenafil (VIAGRA) 100 MG tablet, Take 100 mg by mouth daily as needed for erectile dysfunction., Disp: , Rfl:    tirzepatide (MOUNJARO) 2.5 MG/0.5ML Pen, Inject 2.5 mg into the skin once a week., Disp: 2 mL, Rfl: 0  EXAM:  VITALS per patient if applicable:  GENERAL: alert, oriented, appears well and in  no acute distress  HEENT: atraumatic, conjunttiva clear, no obvious abnormalities on inspection of external nose and ears  NECK: normal movements of the head and neck  LUNGS: on inspection no signs of respiratory distress, breathing rate appears normal, no obvious gross SOB, gasping or wheezing  CV: no obvious cyanosis  MS: moves all visible extremities without noticeable abnormality  PSYCH/NEURO: pleasant and cooperative, no obvious depression or anxiety, speech and thought processing grossly intact  ASSESSMENT AND PLAN:  Discussed the following assessment and plan:  1. Left  groin pain  - AMB referral to orthopedics  2. Other obesity - Increase dose to 5 mg.  - Follow up in one month or sooner if needed - tirzepatide (MOUNJARO) 5 MG/0.5ML Pen; Inject 5 mg into the skin once a week.  Dispense: 6 mL; Refill: 0  3. Bladder wall thickening -Incidental finding.  He does have a urologist.  He will make an appointment with them.  Dorothyann Peng, NP       I discussed the assessment and treatment plan with the patient. The patient was provided an opportunity to ask questions and all were answered. The patient agreed with the plan and demonstrated an understanding of the instructions.   The patient was advised to call back or seek an in-person evaluation if the symptoms worsen or if the condition fails to improve as anticipated.   Dorothyann Peng, NP

## 2021-09-08 ENCOUNTER — Ambulatory Visit (INDEPENDENT_AMBULATORY_CARE_PROVIDER_SITE_OTHER): Payer: BC Managed Care – PPO | Admitting: Orthopaedic Surgery

## 2021-09-08 ENCOUNTER — Encounter: Payer: Self-pay | Admitting: Orthopaedic Surgery

## 2021-09-08 ENCOUNTER — Other Ambulatory Visit: Payer: Self-pay

## 2021-09-08 DIAGNOSIS — M1612 Unilateral primary osteoarthritis, left hip: Secondary | ICD-10-CM | POA: Diagnosis not present

## 2021-09-08 NOTE — Progress Notes (Signed)
Office Visit Note   Patient: Jesus Allen           Date of Birth: 1971/10/02           MRN: [id number] Visit Date: 09/08/2021              Requested by: Dorothyann Peng, NP [address] [place],  [place] [id number] PCP: Dorothyann Peng, NP   Assessment & Plan: Visit Diagnoses:  1. Primary osteoarthritis of left hip     Plan: Based on findings the patient has advanced left hip DJD.  This was confirmed on MRI as well.  Treatment options were discussed in detail to include cortisone injection, oral NSAIDs, weight loss and physical therapy for strengthening.  He is interested in getting cortisone injection as well as more information on hip replacement surgery.  Questions encouraged and answered.  We will see him back as needed.  Follow-Up Instructions: No follow-ups on file.   Orders:  Orders Placed This Encounter  Procedures   Ambulatory referral to Physical Medicine Rehab   No orders of the defined types were placed in this encounter.     Procedures: No procedures performed   Clinical Data: No additional findings.   Subjective: Chief Complaint  Patient presents with   Left Hip - Pain    Mr. Jesus Allen is a very pleasant 50 year old gentleman referral from PCP for chronic left hip and groin pain for about a year.  It began after he started doing insanity workouts.  He works as a Engineer, building services.  Overall the pain is mild but he does notice difficulty doing daily activities such as trouble tying his shoes.  Aleve does help with the pain.  Denies any radicular symptoms.   Review of Systems  Constitutional: Negative.   All other systems reviewed and are negative.   Objective: Vital Signs: There were no vitals taken for this visit.  Physical Exam Vitals and nursing note reviewed.  Constitutional:      Appearance: He is well-developed.  Pulmonary:     Effort: Pulmonary effort is normal.  Abdominal:     Palpations: Abdomen is soft.  Skin:     General: Skin is warm.  Neurological:     Mental Status: He is alert and oriented to person, place, and time.  Psychiatric:        Behavior: Behavior normal.        Thought Content: Thought content normal.        Judgment: Judgment normal.    Ortho Exam  Left hip shows significant limitation range of motion with pain in the groin with flexion of the hip past 90 degrees.  Positive FADIR at 15 degrees.  Lateral hip is nontender.  No sciatic tension signs.  Specialty Comments:  No specialty comments available.  Imaging: No results found.   PMFS History: There are no problems to display for this patient.  Past Medical History:  Diagnosis Date   ED (erectile dysfunction)    Hyperlipidemia    Obesity     Family History  Problem Relation Age of Onset   Ovarian cancer Mother    Diabetes Father    Blindness Father    Stroke Maternal Grandmother    Colon cancer Neg Hx    Colon polyps Neg Hx    Esophageal cancer Neg Hx    Stomach cancer Neg Hx    Rectal cancer Neg Hx     Past Surgical History:  Procedure Laterality Date   FOOT  FRACTURE SURGERY Left    Social History   Occupational History   Not on file  Tobacco Use   Smoking status: Never   Smokeless tobacco: Never  Vaping Use   Vaping Use: Never used  Substance and Sexual Activity   Alcohol use: Yes    Comment: "social"   Drug use: No   Sexual activity: Not on file

## 2021-09-13 ENCOUNTER — Other Ambulatory Visit: Payer: Self-pay

## 2021-09-13 ENCOUNTER — Encounter: Payer: Self-pay | Admitting: Orthopaedic Surgery

## 2021-09-13 DIAGNOSIS — M1612 Unilateral primary osteoarthritis, left hip: Secondary | ICD-10-CM

## 2021-09-15 ENCOUNTER — Ambulatory Visit
Admission: RE | Admit: 2021-09-15 | Discharge: 2021-09-15 | Disposition: A | Discharge status: Home / Self Care | Payer: BC Managed Care – PPO | Source: Ambulatory Visit | Attending: Orthopaedic Surgery | Admitting: Orthopaedic Surgery

## 2021-09-15 ENCOUNTER — Other Ambulatory Visit: Payer: Self-pay

## 2021-09-15 DIAGNOSIS — M1612 Unilateral primary osteoarthritis, left hip: Secondary | ICD-10-CM | POA: Diagnosis not present

## 2021-09-15 MED ORDER — METHYLPREDNISOLONE ACETATE 40 MG/ML INJ SUSP (RADIOLOG
80.0000 mg | Freq: Once | INTRAMUSCULAR | Status: AC
  Administered 2021-09-15: 80 mg via INTRA_ARTICULAR

## 2021-09-15 MED ORDER — IOPAMIDOL (ISOVUE-M 200) INJECTION 41%
1.0000 mL | Freq: Once | INTRAMUSCULAR | Status: AC
  Administered 2021-09-15: 1 mL via INTRA_ARTICULAR

## 2021-10-04 ENCOUNTER — Telehealth (INDEPENDENT_AMBULATORY_CARE_PROVIDER_SITE_OTHER): Payer: BC Managed Care – PPO | Admitting: Adult Health

## 2021-10-04 ENCOUNTER — Encounter: Payer: Self-pay | Admitting: Adult Health

## 2021-10-04 VITALS — Ht 69.5 in | Wt 250.0 lb

## 2021-10-04 DIAGNOSIS — E7439 Other disorders of intestinal carbohydrate absorption: Secondary | ICD-10-CM | POA: Diagnosis not present

## 2021-10-04 DIAGNOSIS — E668 Other obesity: Secondary | ICD-10-CM | POA: Diagnosis not present

## 2021-10-04 DIAGNOSIS — M25552 Pain in left hip: Secondary | ICD-10-CM | POA: Diagnosis not present

## 2021-10-04 MED ORDER — TIRZEPATIDE 7.5 MG/0.5ML ~~LOC~~ SOAJ: 7.5000 mg | SUBCUTANEOUS | 0 refills | Status: DC

## 2021-10-04 NOTE — Progress Notes (Signed)
Virtual Visit via Video Note  I connected with Bethena Roys on 10/04/21 at 11:00 AM EST by a video enabled telemedicine application and verified that I am speaking with the correct person using two identifiers.  Location patient: home Location provider:work or home office Persons participating in the virtual visit: patient, provider  I discussed the limitations of evaluation and management by telemedicine and the availability of in person appointments. The patient expressed understanding and agreed to proceed.   HPI: 50 year old male who is being evaluated today for follow-up regarding loss management/glucose intolerance and left-sided hip pain.  Weight loss management/glucose intolerance -currently prescribed on Mounjaro 5 mg.  Over the last 2 months since being on him on [facility] he has been able to lose approximately 9 pounds.  His pant size has come down from of 42-40.  He has not had any side effects.  He does feel fuller sooner and is eating less.  He is trying to stay as active as possible.  Lab Results  Component Value Date   HGBA1C 5.9 04/12/2021   Wt Readings from Last 3 Encounters:  10/04/21 250 lb (113.4 kg)  09/06/21 255 lb (115.7 kg)  08/09/21 259 lb (117.5 kg)   Left Hip Pain -was found to have osteoarthritis of the left hip during recent x-ray.  He was sent over to orthopedics and had a steroid injection into the left hip.  Reports that since having the steroid injection his hip pain has nearly resolved.  He is feeling much better and has no complaints.  Will eventually need to have a hip replacement though   ROS: See pertinent positives and negatives per HPI.  Past Medical History:  Diagnosis Date   ED (erectile dysfunction)    Hyperlipidemia    Obesity     Past Surgical History:  Procedure Laterality Date   FOOT FRACTURE SURGERY Left     Family History  Problem Relation Age of Onset   Ovarian cancer Mother    Diabetes Father    Blindness Father     Stroke Maternal Grandmother    Colon cancer Neg Hx    Colon polyps Neg Hx    Esophageal cancer Neg Hx    Stomach cancer Neg Hx    Rectal cancer Neg Hx        Current Outpatient Medications:    ketoconazole (NIZORAL) 2 % cream, Apply 1 application topically in the morning and at bedtime., Disp: , Rfl:    ketoconazole (NIZORAL) 2 % shampoo, Apply 1 application topically 3 (three) times a week., Disp: , Rfl:    rosuvastatin (CRESTOR) 5 MG tablet, Take 1 tablet (5 mg total) by mouth daily., Disp: 90 tablet, Rfl: 3   sildenafil (VIAGRA) 100 MG tablet, Take 100 mg by mouth daily as needed for erectile dysfunction., Disp: , Rfl:    tirzepatide (MOUNJARO) 5 MG/0.5ML Pen, Inject 5 mg into the skin once a week., Disp: 6 mL, Rfl: 0  EXAM:  VITALS per patient if applicable:  GENERAL: alert, oriented, appears well and in no acute distress  HEENT: atraumatic, conjunttiva clear, no obvious abnormalities on inspection of external nose and ears  NECK: normal movements of the head and neck  LUNGS: on inspection no signs of respiratory distress, breathing rate appears normal, no obvious gross SOB, gasping or wheezing  CV: no obvious cyanosis  MS: moves all visible extremities without noticeable abnormality  PSYCH/NEURO: pleasant and cooperative, no obvious depression or anxiety, speech and thought processing grossly intact  ASSESSMENT AND PLAN:  Discussed the following assessment and plan:  1. Glucose intolerance -Increase his dose to 7.5 mg weekly.  Advise follow-up in 1 month - tirzepatide (MOUNJARO) 7.5 MG/0.5ML Pen; Inject 7.5 mg into the skin once a week.  Dispense: 6 mL; Refill: 0  2. Other obesity  - tirzepatide (MOUNJARO) 7.5 MG/0.5ML Pen; Inject 7.5 mg into the skin once a week.  Dispense: 6 mL; Refill: 0  3. Left hip pain -Glad his hip is feeling better.  He knows to follow-up with orthopedics as needed  Dorothyann Peng, NP       I discussed the assessment and treatment  plan with the patient. The patient was provided an opportunity to ask questions and all were answered. The patient agreed with the plan and demonstrated an understanding of the instructions.   The patient was advised to call back or seek an in-person evaluation if the symptoms worsen or if the condition fails to improve as anticipated.   Dorothyann Peng, NP

## 2021-10-12 ENCOUNTER — Other Ambulatory Visit: Payer: Self-pay | Admitting: Adult Health

## 2021-10-12 ENCOUNTER — Encounter: Payer: Self-pay | Admitting: Adult Health

## 2021-10-12 DIAGNOSIS — E668 Other obesity: Secondary | ICD-10-CM

## 2021-10-12 DIAGNOSIS — E7439 Other disorders of intestinal carbohydrate absorption: Secondary | ICD-10-CM

## 2021-10-12 MED ORDER — TIRZEPATIDE 7.5 MG/0.5ML ~~LOC~~ SOAJ: 7.5000 mg | SUBCUTANEOUS | 0 refills | Status: DC

## 2021-10-13 ENCOUNTER — Other Ambulatory Visit: Payer: Self-pay | Admitting: Adult Health

## 2021-10-13 DIAGNOSIS — E668 Other obesity: Secondary | ICD-10-CM

## 2021-10-13 DIAGNOSIS — E7439 Other disorders of intestinal carbohydrate absorption: Secondary | ICD-10-CM

## 2021-10-13 MED ORDER — TIRZEPATIDE 7.5 MG/0.5ML ~~LOC~~ SOAJ: 7.5000 mg | SUBCUTANEOUS | 0 refills | Status: DC

## 2021-10-13 MED ORDER — TIRZEPATIDE 5 MG/0.5ML ~~LOC~~ SOAJ: 5.0000 mg | SUBCUTANEOUS | 1 refills | Status: DC

## 2021-10-28 DIAGNOSIS — N401 Enlarged prostate with lower urinary tract symptoms: Secondary | ICD-10-CM | POA: Diagnosis not present

## 2021-10-28 DIAGNOSIS — N5201 Erectile dysfunction due to arterial insufficiency: Secondary | ICD-10-CM | POA: Diagnosis not present

## 2021-10-28 DIAGNOSIS — E349 Endocrine disorder, unspecified: Secondary | ICD-10-CM | POA: Diagnosis not present

## 2021-10-28 DIAGNOSIS — R350 Frequency of micturition: Secondary | ICD-10-CM | POA: Diagnosis not present

## 2021-10-30 ENCOUNTER — Encounter: Payer: Self-pay | Admitting: Orthopaedic Surgery

## 2021-11-01 ENCOUNTER — Encounter: Payer: Self-pay | Admitting: Adult Health

## 2021-11-03 ENCOUNTER — Ambulatory Visit: Payer: BC Managed Care – PPO | Admitting: Adult Health

## 2021-11-04 ENCOUNTER — Telehealth (INDEPENDENT_AMBULATORY_CARE_PROVIDER_SITE_OTHER): Payer: BC Managed Care – PPO | Admitting: Adult Health

## 2021-11-04 DIAGNOSIS — E7439 Other disorders of intestinal carbohydrate absorption: Secondary | ICD-10-CM | POA: Diagnosis not present

## 2021-11-04 MED ORDER — TIRZEPATIDE 10 MG/0.5ML ~~LOC~~ SOAJ: 10.0000 mg | SUBCUTANEOUS | 0 refills | Status: DC

## 2021-11-04 NOTE — Progress Notes (Signed)
Virtual Visit via Video Note  I connected with Jesus Allen on 11/04/21 at 11:00 AM EST by a video enabled telemedicine application and verified that I am speaking with the correct person using two identifiers.  Location patient: home Location provider:work or home office Persons participating in the virtual visit: patient, provider  I discussed the limitations of evaluation and management by telemedicine and the availability of in person appointments. The patient expressed understanding and agreed to proceed.   HPI: Being evaluated today for follow-up regarding glucose intolerance.  He has been months on him on Mounjaro 5 mg for 2 months.  Has been able to lose approximately 9 pounds.  Has not had any side effects from the medication.  Continues to feel as though this curbs his appetite well.  He is trying to stay active.  Current weight is 250 pounds   ROS: See pertinent positives and negatives per HPI.  Past Medical History:  Diagnosis Date   ED (erectile dysfunction)    Hyperlipidemia    Obesity     Past Surgical History:  Procedure Laterality Date   FOOT FRACTURE SURGERY Left     Family History  Problem Relation Age of Onset   Ovarian cancer Mother    Diabetes Father    Blindness Father    Stroke Maternal Grandmother    Colon cancer Neg Hx    Colon polyps Neg Hx    Esophageal cancer Neg Hx    Stomach cancer Neg Hx    Rectal cancer Neg Hx        Current Outpatient Medications:    ketoconazole (NIZORAL) 2 % cream, Apply 1 application topically in the morning and at bedtime., Disp: , Rfl:    ketoconazole (NIZORAL) 2 % shampoo, Apply 1 application topically 3 (three) times a week., Disp: , Rfl:    rosuvastatin (CRESTOR) 5 MG tablet, Take 1 tablet (5 mg total) by mouth daily., Disp: 90 tablet, Rfl: 3   sildenafil (VIAGRA) 100 MG tablet, Take 100 mg by mouth daily as needed for erectile dysfunction., Disp: , Rfl:    tirzepatide (MOUNJARO) 10 MG/0.5ML Pen,  Inject 10 mg into the skin once a week., Disp: 2 mL, Rfl: 0  EXAM:  VITALS per patient if applicable:  GENERAL: alert, oriented, appears well and in no acute distress  HEENT: atraumatic, conjunttiva clear, no obvious abnormalities on inspection of external nose and ears  NECK: normal movements of the head and neck  LUNGS: on inspection no signs of respiratory distress, breathing rate appears normal, no obvious gross SOB, gasping or wheezing  CV: no obvious cyanosis  MS: moves all visible extremities without noticeable abnormality  PSYCH/NEURO: pleasant and cooperative, no obvious depression or anxiety, speech and thought processing grossly intact  ASSESSMENT AND PLAN:  Discussed the following assessment and plan:  1. Glucose intolerance - Will increase his dose of Mounjaro to 10mg  - Follow up with any side effects - Will have him follow up in one month to see how he is doing and consider increasing dose - tirzepatide (MOUNJARO) 10 MG/0.5ML Pen; Inject 10 mg into the skin once a week.  Dispense: 2 mL; Refill: 0      I discussed the assessment and treatment plan with the patient. The patient was provided an opportunity to ask questions and all were answered. The patient agreed with the plan and demonstrated an understanding of the instructions.   The patient was advised to call back or seek an in-person evaluation if the  symptoms worsen or if the condition fails to improve as anticipated.   Dorothyann Peng, NP

## 2021-11-25 DIAGNOSIS — E349 Endocrine disorder, unspecified: Secondary | ICD-10-CM | POA: Diagnosis not present

## 2021-11-25 LAB — TESTOSTERONE: Testosterone: 318.7

## 2021-12-02 ENCOUNTER — Encounter: Payer: Self-pay | Admitting: Orthopaedic Surgery

## 2021-12-07 ENCOUNTER — Encounter: Payer: Self-pay | Admitting: Adult Health

## 2021-12-09 ENCOUNTER — Ambulatory Visit: Payer: Self-pay

## 2021-12-09 ENCOUNTER — Encounter: Payer: Self-pay | Admitting: Adult Health

## 2021-12-09 ENCOUNTER — Encounter: Payer: Self-pay | Admitting: Orthopaedic Surgery

## 2021-12-09 ENCOUNTER — Ambulatory Visit (INDEPENDENT_AMBULATORY_CARE_PROVIDER_SITE_OTHER): Payer: BC Managed Care – PPO | Admitting: Orthopaedic Surgery

## 2021-12-09 ENCOUNTER — Other Ambulatory Visit: Payer: Self-pay

## 2021-12-09 ENCOUNTER — Telehealth (INDEPENDENT_AMBULATORY_CARE_PROVIDER_SITE_OTHER): Payer: BC Managed Care – PPO | Admitting: Adult Health

## 2021-12-09 VITALS — HR 99 | Ht 69.5 in | Wt 248.0 lb

## 2021-12-09 DIAGNOSIS — E7439 Other disorders of intestinal carbohydrate absorption: Secondary | ICD-10-CM

## 2021-12-09 DIAGNOSIS — E668 Other obesity: Secondary | ICD-10-CM

## 2021-12-09 DIAGNOSIS — M1612 Unilateral primary osteoarthritis, left hip: Secondary | ICD-10-CM

## 2021-12-09 DIAGNOSIS — E349 Endocrine disorder, unspecified: Secondary | ICD-10-CM | POA: Diagnosis not present

## 2021-12-09 MED ORDER — TIRZEPATIDE 10 MG/0.5ML ~~LOC~~ SOAJ: 10.0000 mg | SUBCUTANEOUS | 1 refills | Status: DC

## 2021-12-09 NOTE — Progress Notes (Signed)
° °  Office Visit Note   Patient: Jesus Allen           Date of Birth: Aug 03, 1971           MRN: [id number] Visit Date: 12/09/2021              Requested by: Jesus Peng, NP [address] [place],  [place] [address] PCP: Jesus Peng, NP   Assessment & Plan: Visit Diagnoses:  1. Primary osteoarthritis of left hip     Plan: Mr. Jesus Allen returns today to follow-up for chronic severe left hip and groin pain from advanced DJD.  He had a cortisone injection in early November which provided a lot of relief but for only 6 weeks.  He takes Advil on a daily basis to get him through the day.  He realizes that a hip replacement is the most reliable option for pain relief and mobility.  Examination of the left hip is stable and consistent with advanced DJD.  Impression is end-stage left hip DJD.  Again we had a long discussion on treatment options and he has elected to undergo left total hip replacement in the next coming months.  Jesus Allen will call the patient to schedule surgery.  Questions encouraged and answered.  Follow-Up Instructions: No follow-ups on file.   Orders:  Orders Placed This Encounter  Procedures   XR HIP UNILAT W OR W/O PELVIS 2-3 VIEWS LEFT   No orders of the defined types were placed in this encounter.     Procedures: No procedures performed   Clinical Data: No additional findings.   Subjective: Chief Complaint  Patient presents with   Left Hip - Pain    HPI  Review of Systems   Objective: Vital Signs: There were no vitals taken for this visit.  Physical Exam  Ortho Exam  Specialty Comments:  No specialty comments available.  Imaging: XR HIP UNILAT W OR W/O PELVIS 2-3 VIEWS LEFT  Result Date: 12/09/2021 Advanced degenerative joint disease of left hip joint.  Bone-on-bone joint space narrowing.  Large superior acetabular osteophyte.    PMFS History: There are no problems to display for this patient.  Past Medical  History:  Diagnosis Date   ED (erectile dysfunction)    Hyperlipidemia    Obesity     Family History  Problem Relation Age of Onset   Ovarian cancer Mother    Diabetes Father    Blindness Father    Stroke Maternal Grandmother    Colon cancer Neg Hx    Colon polyps Neg Hx    Esophageal cancer Neg Hx    Stomach cancer Neg Hx    Rectal cancer Neg Hx     Past Surgical History:  Procedure Laterality Date   FOOT FRACTURE SURGERY Left    Social History   Occupational History   Not on file  Tobacco Use   Smoking status: Never   Smokeless tobacco: Never  Vaping Use   Vaping Use: Never used  Substance and Sexual Activity   Alcohol use: Yes    Comment: "social"   Drug use: No   Sexual activity: Not on file

## 2021-12-09 NOTE — Progress Notes (Signed)
Virtual Visit via Video Note  I connected with Bethena Roys on 12/09/21 at 11:00 AM EST by a video enabled telemedicine application and verified that I am speaking with the correct person using two identifiers.  Location patient: home Location provider:work or home office Persons participating in the virtual visit: patient, provider  I discussed the limitations of evaluation and management by telemedicine and the availability of in person appointments. The patient expressed understanding and agreed to proceed.   HPI:  51 year old male who is being evaluated today for follow-up regarding glucose intolerance/weight loss management.  Currently prescribed Mounjaro 10 mg weekly.  He has been doing well with this medication and no side effects.  He continues to lose weight, his appetite is significantly decreased and his energy level is good.  He is doing a lot of walking at his job.  So far since September 2022 he is lost about 12 pounds.  Wt Readings from Last 10 Encounters:  12/09/21 248 lb (112.5 kg)  10/04/21 250 lb (113.4 kg)  09/06/21 255 lb (115.7 kg)  08/09/21 259 lb (117.5 kg)  07/25/21 252 lb (114.3 kg)  07/11/21 252 lb (114.3 kg)  04/12/21 265 lb (120.2 kg)  04/07/20 267 lb (121.1 kg)  04/02/20 252 lb (114.3 kg)  03/09/20 258 lb (117 kg)    ROS: See pertinent positives and negatives per HPI.  Past Medical History:  Diagnosis Date   ED (erectile dysfunction)    Hyperlipidemia    Obesity     Past Surgical History:  Procedure Laterality Date   FOOT FRACTURE SURGERY Left     Family History  Problem Relation Age of Onset   Ovarian cancer Mother    Diabetes Father    Blindness Father    Stroke Maternal Grandmother    Colon cancer Neg Hx    Colon polyps Neg Hx    Esophageal cancer Neg Hx    Stomach cancer Neg Hx    Rectal cancer Neg Hx        Current Outpatient Medications:    ketoconazole (NIZORAL) 2 % cream, Apply 1 application topically in the morning  and at bedtime., Disp: , Rfl:    ketoconazole (NIZORAL) 2 % shampoo, Apply 1 application topically 3 (three) times a week., Disp: , Rfl:    rosuvastatin (CRESTOR) 5 MG tablet, Take 1 tablet (5 mg total) by mouth daily., Disp: 90 tablet, Rfl: 3   sildenafil (VIAGRA) 100 MG tablet, Take 100 mg by mouth daily as needed for erectile dysfunction., Disp: , Rfl:    tirzepatide (MOUNJARO) 10 MG/0.5ML Pen, Inject 10 mg into the skin once a week., Disp: 2 mL, Rfl: 0  EXAM:  VITALS per patient if applicable:  GENERAL: alert, oriented, appears well and in no acute distress  HEENT: atraumatic, conjunttiva clear, no obvious abnormalities on inspection of external nose and ears  NECK: normal movements of the head and neck  LUNGS: on inspection no signs of respiratory distress, breathing rate appears normal, no obvious gross SOB, gasping or wheezing  CV: no obvious cyanosis  MS: moves all visible extremities without noticeable abnormality  PSYCH/NEURO: pleasant and cooperative, no obvious depression or anxiety, speech and thought processing grossly intact  ASSESSMENT AND PLAN:  Discussed the following assessment and plan: -We will keep him on him on Mounjaro 10 mg for the next 3 to 6 months, follow-up at his physical exam.  He was advised to let me know if he has any plateauing and weight loss.  Continue to eat healthy and stay active      I discussed the assessment and treatment plan with the patient. The patient was provided an opportunity to ask questions and all were answered. The patient agreed with the plan and demonstrated an understanding of the instructions.   The patient was advised to call back or seek an in-person evaluation if the symptoms worsen or if the condition fails to improve as anticipated.   Dorothyann Peng, NP

## 2021-12-12 ENCOUNTER — Encounter: Payer: Self-pay | Admitting: Adult Health

## 2021-12-12 ENCOUNTER — Other Ambulatory Visit: Payer: Self-pay | Admitting: Adult Health

## 2021-12-12 DIAGNOSIS — E7439 Other disorders of intestinal carbohydrate absorption: Secondary | ICD-10-CM

## 2021-12-13 ENCOUNTER — Other Ambulatory Visit: Payer: Self-pay | Admitting: Adult Health

## 2021-12-13 MED ORDER — TIRZEPATIDE 12.5 MG/0.5ML ~~LOC~~ SOAJ: 12.5000 mg | SUBCUTANEOUS | 0 refills | Status: DC

## 2021-12-13 NOTE — Telephone Encounter (Signed)
FYI

## 2021-12-22 DIAGNOSIS — R350 Frequency of micturition: Secondary | ICD-10-CM | POA: Diagnosis not present

## 2021-12-22 DIAGNOSIS — N401 Enlarged prostate with lower urinary tract symptoms: Secondary | ICD-10-CM | POA: Diagnosis not present

## 2021-12-22 DIAGNOSIS — E349 Endocrine disorder, unspecified: Secondary | ICD-10-CM | POA: Diagnosis not present

## 2021-12-27 ENCOUNTER — Other Ambulatory Visit: Payer: Self-pay

## 2021-12-27 ENCOUNTER — Encounter: Payer: Self-pay | Admitting: Adult Health

## 2022-01-26 ENCOUNTER — Other Ambulatory Visit: Payer: Self-pay | Admitting: Physician Assistant

## 2022-01-26 MED ORDER — METHOCARBAMOL 500 MG PO TABS: 500.0000 mg | ORAL_TABLET | Freq: Two times a day (BID) | ORAL | 2 refills | Status: DC | PRN

## 2022-01-26 MED ORDER — OXYCODONE-ACETAMINOPHEN 5-325 MG PO TABS: 1.0000 | ORAL_TABLET | Freq: Four times a day (QID) | ORAL | 0 refills | Status: DC | PRN

## 2022-01-26 MED ORDER — DOCUSATE SODIUM 100 MG PO CAPS: 100.0000 mg | ORAL_CAPSULE | Freq: Every day | ORAL | 2 refills | Status: DC | PRN

## 2022-01-26 MED ORDER — ASPIRIN EC 81 MG PO TBEC: 81.0000 mg | DELAYED_RELEASE_TABLET | Freq: Two times a day (BID) | ORAL | 0 refills | Status: DC

## 2022-01-26 MED ORDER — ONDANSETRON HCL 4 MG PO TABS: 4.0000 mg | ORAL_TABLET | Freq: Three times a day (TID) | ORAL | 0 refills | Status: DC | PRN

## 2022-01-26 NOTE — Progress Notes (Addendum)
Surgical Instructions ? ? ? Your procedure is scheduled on Monday, March 20th, 2023. ? ? Report to [facility] Main Entrance "A" at 10:00 A.M., then check in with the Admitting office. ? Call this number if you have problems the morning of surgery: ? [phone] ? ? If you have any questions prior to your surgery date call [phone]: Open Monday-Friday 8am-4pm ? ? ? Remember: ? Do not eat after midnight the night before your surgery ? ?You may drink clear liquids until 09:00 the morning of your surgery.   ?Clear liquids allowed are: Water, Non-Citrus Juices (without pulp), Carbonated Beverages, Clear Tea, Black Coffee ONLY (NO MILK, CREAM OR POWDERED CREAMER of any kind), and Gatorade ? ?Patient Instructions ? ?The night before surgery:  ?No food after midnight. ONLY clear liquids after midnight ? ?The day of surgery (if you do NOT have diabetes):  ?Drink ONE (1) Pre-Surgery Clear Ensure by 09:00 the morning of surgery. Drink in one sitting. Do not sip.  ?This drink was given to you during your hospital  ?pre-op appointment visit. ? ?Nothing else to drink after completing the  ?Pre-Surgery Clear Ensure. ? ? ?       If you have questions, please contact your surgeon?s office.  ?  ? Take these medicines the morning of surgery with A SIP OF WATER:  ? ?rosuvastatin (CRESTOR) ? ?As of today, STOP taking any Aspirin (unless otherwise instructed by your surgeon) Aleve, Naproxen, Ibuprofen, Motrin, Advil, Goody's, BC's, all herbal medications, fish oil, and all vitamins. ? ? The day of surgery: ?         ?Do not wear jewelry  ?Do not wear lotions, powders, colognes, or deodorant. ?Men may shave face and neck. ?Do not bring valuables to the hospital. ? ? ?[facility] is not responsible for any belongings or valuables. .  ? ?Do NOT Smoke (Tobacco/Vaping)  24 hours prior to your procedure ? ?If you use a CPAP at night, you may bring your mask for your overnight stay. ?  ?Contacts, glasses, hearing aids, dentures or partials  may not be worn into surgery, please bring cases for these belongings ?  ?For patients admitted to the hospital, discharge time will be determined by your treatment team. ?  ?Patients discharged the day of surgery will not be allowed to drive home, and someone needs to stay with them for 24 hours. ? ?NO VISITORS WILL BE ALLOWED IN PRE-OP WHERE PATIENTS ARE PREPPED FOR SURGERY.  ONLY 1 SUPPORT PERSON MAY BE PRESENT IN THE WAITING ROOM WHILE YOU ARE IN SURGERY.  IF YOU ARE TO BE ADMITTED, ONCE YOU ARE IN YOUR ROOM YOU WILL BE ALLOWED TWO (2) VISITORS. 1 (ONE) VISITOR MAY STAY OVERNIGHT BUT MUST ARRIVE TO THE ROOM BY 8pm.  Minor children may have two parents present. Special consideration for safety and communication needs will be reviewed on a case by case basis. ? ?Special instructions:   ? ?Oral Hygiene is also important to reduce your risk of infection.  Remember - BRUSH YOUR TEETH THE MORNING OF SURGERY WITH YOUR REGULAR TOOTHPASTE ? ? ?[facility]- Preparing For Surgery ? ?Before surgery, you can play an important role. Because skin is not sterile, your skin needs to be as free of germs as possible. You can reduce the number of germs on your skin by washing with CHG (chlorahexidine gluconate) Soap before surgery.  CHG is an antiseptic cleaner which kills germs and bonds with the skin to continue killing germs  even after washing.   ? ? ?Please do not use if you have an allergy to CHG or antibacterial soaps. If your skin becomes reddened/irritated stop using the CHG.  ?Do not shave (including legs and underarms) for at least 48 hours prior to first CHG shower. It is OK to shave your face. ? ?Please follow these instructions carefully. ?  ? ? Shower the NIGHT BEFORE SURGERY and the MORNING OF SURGERY with CHG Soap.  ? If you chose to wash your hair, wash your hair first as usual with your normal shampoo. After you shampoo, rinse your hair and body thoroughly to remove the shampoo.  Then [employer] and genitals  (private parts) with your normal soap and rinse thoroughly to remove soap. ? ?After that Use CHG Soap as you would any other liquid soap. You can apply CHG directly to the skin and wash gently with a scrungie or a clean washcloth.  ? ?Apply the CHG Soap to your body ONLY FROM THE NECK DOWN.  Do not use on open wounds or open sores. Avoid contact with your eyes, ears, mouth and genitals (private parts). Wash Face and genitals (private parts)  with your normal soap.  ? ?Wash thoroughly, paying special attention to the area where your surgery will be performed. ? ?Thoroughly rinse your body with warm water from the neck down. ? ?DO NOT shower/wash with your normal soap after using and rinsing off the CHG Soap. ? ?Pat yourself dry with a CLEAN TOWEL. ? ?Wear CLEAN PAJAMAS to bed the night before surgery ? ?Place CLEAN SHEETS on your bed the night before your surgery ? ?DO NOT SLEEP WITH PETS. ? ? ?Day of Surgery: ? ?Take a shower with CHG soap. ?Wear Clean/Comfortable clothing the morning of surgery ?Do not apply any deodorants/lotions.   ?Remember to brush your teeth WITH YOUR REGULAR TOOTHPASTE. ? ?  ?Please read over the following fact sheets that you were given.   ?

## 2022-01-27 ENCOUNTER — Other Ambulatory Visit: Payer: Self-pay

## 2022-01-27 ENCOUNTER — Encounter (HOSPITAL_COMMUNITY)
Admission: RE | Admit: 2022-01-27 | Discharge: 2022-01-27 | Disposition: A | Discharge status: Home / Self Care | Payer: BC Managed Care – PPO | Source: Ambulatory Visit | Attending: Orthopaedic Surgery | Admitting: Orthopaedic Surgery

## 2022-01-27 ENCOUNTER — Encounter (HOSPITAL_COMMUNITY): Payer: Self-pay

## 2022-01-27 VITALS — BP 140/80 | HR 95 | Temp 97.9°F | Resp 18 | Ht 70.0 in | Wt 256.0 lb

## 2022-01-27 DIAGNOSIS — Z01812 Encounter for preprocedural laboratory examination: Secondary | ICD-10-CM | POA: Diagnosis not present

## 2022-01-27 DIAGNOSIS — M1612 Unilateral primary osteoarthritis, left hip: Secondary | ICD-10-CM | POA: Insufficient documentation

## 2022-01-27 DIAGNOSIS — Z01818 Encounter for other preprocedural examination: Secondary | ICD-10-CM

## 2022-01-27 HISTORY — DX: Pneumonia, unspecified organism: J18.9

## 2022-01-27 LAB — CBC WITH DIFFERENTIAL/PLATELET
Abs Immature Granulocytes: 0.02 10*3/uL (ref 0.00–0.07)
Basophils Absolute: 0 10*3/uL (ref 0.0–0.1)
Basophils Relative: 1 %
Eosinophils Absolute: 0.1 10*3/uL (ref 0.0–0.5)
Eosinophils Relative: 1 %
HCT: 43 % (ref 39.0–52.0)
Hemoglobin: 15.1 g/dL (ref 13.0–17.0)
Immature Granulocytes: 1 %
Lymphocytes Relative: 30 %
Lymphs Abs: 1.1 10*3/uL (ref 0.7–4.0)
MCH: 32.9 pg (ref 26.0–34.0)
MCHC: 35.1 g/dL (ref 30.0–36.0)
MCV: 93.7 fL (ref 80.0–100.0)
Monocytes Absolute: 0.5 10*3/uL (ref 0.1–1.0)
Monocytes Relative: 13 %
Neutro Abs: 1.9 10*3/uL (ref 1.7–7.7)
Neutrophils Relative %: 54 %
Platelets: 282 10*3/uL (ref 150–400)
RBC: 4.59 MIL/uL (ref 4.22–5.81)
RDW: 13.1 % (ref 11.5–15.5)
WBC: 3.5 10*3/uL — ABNORMAL LOW (ref 4.0–10.5)
nRBC: 0 % (ref 0.0–0.2)

## 2022-01-27 LAB — COMPREHENSIVE METABOLIC PANEL
ALT: 37 U/L (ref 0–44)
AST: 30 U/L (ref 15–41)
Albumin: 4.1 g/dL (ref 3.5–5.0)
Alkaline Phosphatase: 81 U/L (ref 38–126)
Anion gap: 7 (ref 5–15)
BUN: 8 mg/dL (ref 6–20)
CO2: 27 mmol/L (ref 22–32)
Calcium: 9 mg/dL (ref 8.9–10.3)
Chloride: 105 mmol/L (ref 98–111)
Creatinine, Ser: 0.88 mg/dL (ref 0.61–1.24)
GFR, Estimated: >60 mL/min (ref >60)
Glucose, Bld: 102 mg/dL — ABNORMAL HIGH (ref 70–99)
Potassium: 3.7 mmol/L (ref 3.5–5.1)
Sodium: 139 mmol/L (ref 135–145)
Total Bilirubin: 1.3 mg/dL — ABNORMAL HIGH (ref 0.3–1.2)
Total Protein: 7.2 g/dL (ref 6.5–8.1)

## 2022-01-27 LAB — TYPE AND SCREEN
ABO/RH(D): O POS
Antibody Screen: NEGATIVE

## 2022-01-27 LAB — SURGICAL PCR SCREEN

## 2022-01-27 MED ORDER — TRANEXAMIC ACID 1000 MG/10ML IV SOLN
2000.0000 mg | INTRAVENOUS | Status: AC
  Filled 2022-01-27: qty 20

## 2022-01-27 NOTE — Progress Notes (Signed)
Per microbiology lab MRSA PCR result is invalid. The test will be repeated the day of surgery. ?

## 2022-01-27 NOTE — Progress Notes (Signed)
PCP - Dorothyann Peng, NP ?Cardiologist - denies ? ?PPM/ICD - denies ?Device Orders - n/a ?Rep Notified - n/a ? ?Chest x-ray - n/a ?EKG - n/a ?Stress Test - denies ?ECHO - denies ?Cardiac Cath - denies ? ?Sleep Study - denies ?CPAP - n/a ? ?Fasting Blood Sugar - n/a ? ?Blood Thinner Instructions: n/a ? ?Aspirin Instructions: patient will start taking Aspirin after surgery ? ?Patient was instructed: As of today, STOP taking any Aspirin (unless otherwise instructed by your surgeon) Aleve, Naproxen, Ibuprofen, Motrin, Advil, Goody's, BC's, all herbal medications, fish oil, and all vitamins. ? ?ERAS Protcol - yes ?PRE-SURGERY Ensure - yes, until 09:00 o'clock ? ?COVID TEST- no per new policy ? ? ?Anesthesia review: no ? ?Patient denies shortness of breath, fever, cough and chest pain at PAT appointment ? ? ?All instructions explained to the patient, with a verbal understanding of the material. Patient agrees to go over the instructions while at home for a better understanding. Patient also instructed to self quarantine after being tested for COVID-19. The opportunity to ask questions was provided. ?  ?

## 2022-01-29 DIAGNOSIS — M1612 Unilateral primary osteoarthritis, left hip: Secondary | ICD-10-CM

## 2022-01-30 ENCOUNTER — Ambulatory Visit (HOSPITAL_COMMUNITY): Payer: BC Managed Care – PPO | Admitting: Anesthesiology

## 2022-01-30 ENCOUNTER — Other Ambulatory Visit: Payer: Self-pay

## 2022-01-30 ENCOUNTER — Observation Stay (HOSPITAL_COMMUNITY): Payer: BC Managed Care – PPO

## 2022-01-30 ENCOUNTER — Ambulatory Visit (HOSPITAL_COMMUNITY): Payer: BC Managed Care – PPO

## 2022-01-30 ENCOUNTER — Observation Stay (HOSPITAL_COMMUNITY)
Admission: RE | Admit: 2022-01-30 | Discharge: 2022-01-31 | Disposition: A | Discharge status: Home Health | Payer: BC Managed Care – PPO | Attending: Orthopaedic Surgery | Admitting: Orthopaedic Surgery

## 2022-01-30 ENCOUNTER — Encounter (HOSPITAL_COMMUNITY)
Admission: RE | Disposition: A | Discharge status: Home Health | Payer: Self-pay | Source: Home / Self Care | Attending: Orthopaedic Surgery

## 2022-01-30 ENCOUNTER — Encounter (HOSPITAL_COMMUNITY): Payer: Self-pay | Admitting: Orthopaedic Surgery

## 2022-01-30 DIAGNOSIS — Z7982 Long term (current) use of aspirin: Secondary | ICD-10-CM | POA: Diagnosis not present

## 2022-01-30 DIAGNOSIS — M1612 Unilateral primary osteoarthritis, left hip: Secondary | ICD-10-CM | POA: Diagnosis not present

## 2022-01-30 DIAGNOSIS — Z96642 Presence of left artificial hip joint: Secondary | ICD-10-CM

## 2022-01-30 DIAGNOSIS — Z79899 Other long term (current) drug therapy: Secondary | ICD-10-CM | POA: Insufficient documentation

## 2022-01-30 DIAGNOSIS — Z471 Aftercare following joint replacement surgery: Secondary | ICD-10-CM | POA: Diagnosis not present

## 2022-01-30 HISTORY — PX: TOTAL HIP ARTHROPLASTY: SHX124

## 2022-01-30 LAB — SURGICAL PCR SCREEN
MRSA, PCR: NEGATIVE
Staphylococcus aureus: NEGATIVE

## 2022-01-30 LAB — ABO/RH: ABO/RH(D): O POS

## 2022-01-30 SURGERY — ARTHROPLASTY, HIP, TOTAL, ANTERIOR APPROACH
Anesthesia: Spinal | Site: Hip | Laterality: Left

## 2022-01-30 MED ORDER — SODIUM CHLORIDE 0.9 % IV SOLN: INTRAVENOUS | Status: DC

## 2022-01-30 MED ORDER — METOCLOPRAMIDE HCL 5 MG PO TABS: 5.0000 mg | ORAL_TABLET | Freq: Three times a day (TID) | ORAL | Status: DC | PRN

## 2022-01-30 MED ORDER — MIDAZOLAM HCL 2 MG/2ML IJ SOLN
INTRAMUSCULAR | Status: AC
  Filled 2022-01-30: qty 2

## 2022-01-30 MED ORDER — CEFAZOLIN SODIUM-DEXTROSE 2-4 GM/100ML-% IV SOLN
2.0000 g | Freq: Four times a day (QID) | INTRAVENOUS | Status: AC
  Administered 2022-01-30 – 2022-01-31 (×2): 2 g via INTRAVENOUS
  Filled 2022-01-30 (×2): qty 100

## 2022-01-30 MED ORDER — FENTANYL CITRATE (PF) 250 MCG/5ML IJ SOLN
INTRAMUSCULAR | Status: AC
  Filled 2022-01-30: qty 5

## 2022-01-30 MED ORDER — POVIDONE-IODINE 10 % EX SWAB
2.0000 "application " | Freq: Once | CUTANEOUS | Status: AC
  Administered 2022-01-30: 2 via TOPICAL

## 2022-01-30 MED ORDER — PHENYLEPHRINE HCL (PRESSORS) 10 MG/ML IV SOLN
INTRAVENOUS | Status: AC
  Filled 2022-01-30: qty 1

## 2022-01-30 MED ORDER — HYDROMORPHONE HCL 1 MG/ML IJ SOLN: 0.5000 mg | INTRAMUSCULAR | Status: DC | PRN

## 2022-01-30 MED ORDER — SODIUM CHLORIDE 0.9 % IR SOLN
Status: DC | PRN
  Administered 2022-01-30: 1000 mL

## 2022-01-30 MED ORDER — BUPIVACAINE IN DEXTROSE 0.75-8.25 % IT SOLN
INTRATHECAL | Status: DC | PRN
  Administered 2022-01-30: 2 mL via INTRATHECAL

## 2022-01-30 MED ORDER — 0.9 % SODIUM CHLORIDE (POUR BTL) OPTIME
TOPICAL | Status: DC | PRN
  Administered 2022-01-30: 1000 mL

## 2022-01-30 MED ORDER — TRANEXAMIC ACID-NACL 1000-0.7 MG/100ML-% IV SOLN
1000.0000 mg | INTRAVENOUS | Status: AC
  Administered 2022-01-30: 1000 mg via INTRAVENOUS
  Filled 2022-01-30: qty 100

## 2022-01-30 MED ORDER — POLYETHYLENE GLYCOL 3350 17 G PO PACK: 17.0000 g | PACK | Freq: Every day | ORAL | Status: DC

## 2022-01-30 MED ORDER — OXYCODONE HCL 5 MG PO TABS
5.0000 mg | ORAL_TABLET | ORAL | Status: DC | PRN
  Administered 2022-01-30 – 2022-01-31 (×4): 10 mg via ORAL
  Filled 2022-01-30 (×3): qty 2

## 2022-01-30 MED ORDER — OXYCODONE HCL ER 10 MG PO T12A
10.0000 mg | EXTENDED_RELEASE_TABLET | Freq: Two times a day (BID) | ORAL | Status: DC
  Administered 2022-01-30 (×2): 10 mg via ORAL
  Filled 2022-01-30 (×2): qty 1

## 2022-01-30 MED ORDER — PRONTOSAN WOUND IRRIGATION OPTIME
TOPICAL | Status: DC | PRN
  Administered 2022-01-30: 1 via TOPICAL

## 2022-01-30 MED ORDER — SORBITOL 70 % SOLN: 30.0000 mL | Freq: Every day | Status: DC | PRN

## 2022-01-30 MED ORDER — VANCOMYCIN HCL 1 G IV SOLR
INTRAVENOUS | Status: DC | PRN
  Administered 2022-01-30: 1000 mg

## 2022-01-30 MED ORDER — ASPIRIN 81 MG PO CHEW
81.0000 mg | CHEWABLE_TABLET | Freq: Two times a day (BID) | ORAL | Status: DC
  Administered 2022-01-30 – 2022-01-31 (×2): 81 mg via ORAL
  Filled 2022-01-30 (×2): qty 1

## 2022-01-30 MED ORDER — PANTOPRAZOLE SODIUM 40 MG PO TBEC
40.0000 mg | DELAYED_RELEASE_TABLET | Freq: Every day | ORAL | Status: DC
  Administered 2022-01-30 – 2022-01-31 (×2): 40 mg via ORAL
  Filled 2022-01-30 (×2): qty 1

## 2022-01-30 MED ORDER — ONDANSETRON HCL 4 MG/2ML IJ SOLN: 4.0000 mg | Freq: Four times a day (QID) | INTRAMUSCULAR | Status: DC | PRN

## 2022-01-30 MED ORDER — PHENYLEPHRINE 40 MCG/ML (10ML) SYRINGE FOR IV PUSH (FOR BLOOD PRESSURE SUPPORT)
PREFILLED_SYRINGE | INTRAVENOUS | Status: DC | PRN
  Administered 2022-01-30: 120 ug via INTRAVENOUS

## 2022-01-30 MED ORDER — METOCLOPRAMIDE HCL 5 MG/ML IJ SOLN: 5.0000 mg | Freq: Three times a day (TID) | INTRAMUSCULAR | Status: DC | PRN

## 2022-01-30 MED ORDER — TRANEXAMIC ACID-NACL 1000-0.7 MG/100ML-% IV SOLN
1000.0000 mg | Freq: Once | INTRAVENOUS | Status: AC
  Administered 2022-01-30: 1000 mg via INTRAVENOUS
  Filled 2022-01-30: qty 100

## 2022-01-30 MED ORDER — OXYCODONE HCL 5 MG PO TABS
ORAL_TABLET | ORAL | Status: AC
  Filled 2022-01-30: qty 2

## 2022-01-30 MED ORDER — FENTANYL CITRATE (PF) 100 MCG/2ML IJ SOLN: 25.0000 ug | INTRAMUSCULAR | Status: DC | PRN

## 2022-01-30 MED ORDER — LACTATED RINGERS IV SOLN: INTRAVENOUS | Status: DC

## 2022-01-30 MED ORDER — DOCUSATE SODIUM 100 MG PO CAPS
100.0000 mg | ORAL_CAPSULE | Freq: Two times a day (BID) | ORAL | Status: DC
  Administered 2022-01-30 – 2022-01-31 (×3): 100 mg via ORAL
  Filled 2022-01-30 (×3): qty 1

## 2022-01-30 MED ORDER — METHOCARBAMOL 500 MG PO TABS
500.0000 mg | ORAL_TABLET | Freq: Four times a day (QID) | ORAL | Status: DC | PRN
  Administered 2022-01-30 – 2022-01-31 (×4): 500 mg via ORAL
  Filled 2022-01-30 (×3): qty 1

## 2022-01-30 MED ORDER — METHOCARBAMOL 500 MG PO TABS
ORAL_TABLET | ORAL | Status: AC
  Filled 2022-01-30: qty 1

## 2022-01-30 MED ORDER — DIPHENHYDRAMINE HCL 12.5 MG/5ML PO ELIX
25.0000 mg | ORAL_SOLUTION | ORAL | Status: DC | PRN
  Filled 2022-01-30: qty 10

## 2022-01-30 MED ORDER — VANCOMYCIN HCL 1000 MG IV SOLR
INTRAVENOUS | Status: AC
  Filled 2022-01-30: qty 20

## 2022-01-30 MED ORDER — CEFAZOLIN SODIUM-DEXTROSE 2-4 GM/100ML-% IV SOLN
2.0000 g | INTRAVENOUS | Status: AC
  Administered 2022-01-30: 2 g via INTRAVENOUS
  Filled 2022-01-30: qty 100

## 2022-01-30 MED ORDER — OXYCODONE HCL 5 MG PO TABS: 10.0000 mg | ORAL_TABLET | ORAL | Status: DC | PRN

## 2022-01-30 MED ORDER — ONDANSETRON HCL 4 MG/2ML IJ SOLN: 4.0000 mg | Freq: Once | INTRAMUSCULAR | Status: DC | PRN

## 2022-01-30 MED ORDER — ACETAMINOPHEN 325 MG PO TABS: 325.0000 mg | ORAL_TABLET | Freq: Four times a day (QID) | ORAL | Status: DC | PRN

## 2022-01-30 MED ORDER — ONDANSETRON HCL 4 MG/2ML IJ SOLN
INTRAMUSCULAR | Status: DC | PRN
  Administered 2022-01-30: 4 mg via INTRAVENOUS

## 2022-01-30 MED ORDER — STERILE WATER FOR IRRIGATION IR SOLN
Status: DC | PRN
  Administered 2022-01-30: 1000 mL

## 2022-01-30 MED ORDER — DEXAMETHASONE SODIUM PHOSPHATE 10 MG/ML IJ SOLN
10.0000 mg | Freq: Once | INTRAMUSCULAR | Status: AC
  Administered 2022-01-31: 10 mg via INTRAVENOUS
  Filled 2022-01-30: qty 1

## 2022-01-30 MED ORDER — ALUM & MAG HYDROXIDE-SIMETH 200-200-20 MG/5ML PO SUSP: 30.0000 mL | ORAL | Status: DC | PRN

## 2022-01-30 MED ORDER — ACETAMINOPHEN 500 MG PO TABS
1000.0000 mg | ORAL_TABLET | Freq: Four times a day (QID) | ORAL | Status: DC
  Administered 2022-01-30 – 2022-01-31 (×3): 1000 mg via ORAL
  Filled 2022-01-30 (×3): qty 2

## 2022-01-30 MED ORDER — MIDAZOLAM HCL 2 MG/2ML IJ SOLN
INTRAMUSCULAR | Status: DC | PRN
  Administered 2022-01-30: 2 mg via INTRAVENOUS

## 2022-01-30 MED ORDER — FENTANYL CITRATE (PF) 250 MCG/5ML IJ SOLN
INTRAMUSCULAR | Status: DC | PRN
  Administered 2022-01-30: 50 ug via INTRAVENOUS

## 2022-01-30 MED ORDER — LACTATED RINGERS IV SOLN: INTRAVENOUS | Status: DC | PRN

## 2022-01-30 MED ORDER — PHENOL 1.4 % MT LIQD: 1.0000 | OROMUCOSAL | Status: DC | PRN

## 2022-01-30 MED ORDER — CHLORHEXIDINE GLUCONATE 0.12 % MT SOLN
15.0000 mL | Freq: Once | OROMUCOSAL | Status: AC
  Administered 2022-01-30: 15 mL via OROMUCOSAL
  Filled 2022-01-30: qty 15

## 2022-01-30 MED ORDER — BUPIVACAINE-MELOXICAM ER 400-12 MG/14ML IJ SOLN
INTRAMUSCULAR | Status: AC
  Filled 2022-01-30: qty 1

## 2022-01-30 MED ORDER — MENTHOL 3 MG MT LOZG: 1.0000 | LOZENGE | OROMUCOSAL | Status: DC | PRN

## 2022-01-30 MED ORDER — TRANEXAMIC ACID 1000 MG/10ML IV SOLN
INTRAVENOUS | Status: DC | PRN
  Administered 2022-01-30: 2000 mg via TOPICAL

## 2022-01-30 MED ORDER — ONDANSETRON HCL 4 MG PO TABS: 4.0000 mg | ORAL_TABLET | Freq: Four times a day (QID) | ORAL | Status: DC | PRN

## 2022-01-30 MED ORDER — PROPOFOL 500 MG/50ML IV EMUL
INTRAVENOUS | Status: DC | PRN
  Administered 2022-01-30: 75 ug/kg/min via INTRAVENOUS

## 2022-01-30 MED ORDER — BUPIVACAINE-MELOXICAM ER 400-12 MG/14ML IJ SOLN
INTRAMUSCULAR | Status: DC | PRN
  Administered 2022-01-30: 400 mg

## 2022-01-30 MED ORDER — PHENYLEPHRINE HCL-NACL 20-0.9 MG/250ML-% IV SOLN
INTRAVENOUS | Status: DC | PRN
  Administered 2022-01-30: 25 ug/min via INTRAVENOUS

## 2022-01-30 MED ORDER — METHOCARBAMOL 1000 MG/10ML IJ SOLN
500.0000 mg | Freq: Four times a day (QID) | INTRAVENOUS | Status: DC | PRN
  Filled 2022-01-30: qty 5

## 2022-01-30 MED ORDER — ONDANSETRON HCL 4 MG/2ML IJ SOLN
INTRAMUSCULAR | Status: AC
  Filled 2022-01-30: qty 2

## 2022-01-30 MED ORDER — ACETAMINOPHEN 500 MG PO TABS
1000.0000 mg | ORAL_TABLET | Freq: Once | ORAL | Status: AC
  Administered 2022-01-30: 1000 mg via ORAL
  Filled 2022-01-30: qty 2

## 2022-01-30 MED ORDER — ORAL CARE MOUTH RINSE: 15.0000 mL | Freq: Once | OROMUCOSAL | Status: AC

## 2022-01-30 SURGICAL SUPPLY — 67 items
ACETAB CUP W/GRIPTION 54 (Plate) ×2 IMPLANT
BAG COUNTER SPONGE SURGICOUNT (BAG) ×2 IMPLANT
BAG DECANTER FOR FLEXI CONT (MISCELLANEOUS) ×2 IMPLANT
CELLS DAT CNTRL 66122 CELL SVR (MISCELLANEOUS) IMPLANT
COOLER ICEMAN CLASSIC (MISCELLANEOUS) ×1 IMPLANT
COVER PERINEAL POST (MISCELLANEOUS) ×2 IMPLANT
COVER SURGICAL LIGHT HANDLE (MISCELLANEOUS) ×2 IMPLANT
CUP ACETAB W/GRIPTION 54 (Plate) IMPLANT
DERMABOND ADVANCED (GAUZE/BANDAGES/DRESSINGS) ×1
DERMABOND ADVANCED .7 DNX12 (GAUZE/BANDAGES/DRESSINGS) IMPLANT
DRAPE C-ARM 42X72 X-RAY (DRAPES) ×2 IMPLANT
DRAPE POUCH INSTRU U-SHP 10X18 (DRAPES) ×2 IMPLANT
DRAPE STERI IOBAN 125X83 (DRAPES) ×2 IMPLANT
DRAPE U-SHAPE 47X51 STRL (DRAPES) ×4 IMPLANT
DRESSING AQUACEL AG SP 3.5X10 (GAUZE/BANDAGES/DRESSINGS) IMPLANT
DRSG AQUACEL AG ADV 3.5X10 (GAUZE/BANDAGES/DRESSINGS) ×2 IMPLANT
DRSG AQUACEL AG SP 3.5X10 (GAUZE/BANDAGES/DRESSINGS) ×2
DURAPREP 26ML APPLICATOR (WOUND CARE) ×4 IMPLANT
ELECT BLADE 4.0 EZ CLEAN MEGAD (MISCELLANEOUS) ×2
ELECT REM PT RETURN 9FT ADLT (ELECTROSURGICAL) ×2
ELECTRODE BLDE 4.0 EZ CLN MEGD (MISCELLANEOUS) ×1 IMPLANT
ELECTRODE REM PT RTRN 9FT ADLT (ELECTROSURGICAL) ×1 IMPLANT
GLOVE BIOGEL PI IND STRL 7.5 (GLOVE) ×4 IMPLANT
GLOVE BIOGEL PI INDICATOR 7.5 (GLOVE) ×4
GLOVE SURG LTX SZ7 (GLOVE) ×4 IMPLANT
GLOVE SURG UNDER POLY LF SZ7 (GLOVE) ×4 IMPLANT
GLOVE SURG UNDER POLY LF SZ7.5 (GLOVE) ×4 IMPLANT
GOWN STRL REIN XL XLG (GOWN DISPOSABLE) ×2 IMPLANT
GOWN STRL REUS W/ TWL LRG LVL3 (GOWN DISPOSABLE) IMPLANT
GOWN STRL REUS W/ TWL XL LVL3 (GOWN DISPOSABLE) ×1 IMPLANT
GOWN STRL REUS W/TWL LRG LVL3 (GOWN DISPOSABLE)
GOWN STRL REUS W/TWL XL LVL3 (GOWN DISPOSABLE) ×1
HANDPIECE INTERPULSE COAX TIP (DISPOSABLE) ×1
HEAD CERAMIC DELTA 36 PLUS 1.5 (Hips) ×1 IMPLANT
HOOD PEEL AWAY FLYTE STAYCOOL (MISCELLANEOUS) ×4 IMPLANT
IV NS IRRIG 3000ML ARTHROMATIC (IV SOLUTION) ×2 IMPLANT
JET LAVAGE IRRISEPT WOUND (IRRIGATION / IRRIGATOR) ×2
KIT BASIN OR (CUSTOM PROCEDURE TRAY) ×2 IMPLANT
LAVAGE JET IRRISEPT WOUND (IRRIGATION / IRRIGATOR) ×1 IMPLANT
LINER NEUTRAL 54X36MM PLUS 4 (Hips) ×1 IMPLANT
MARKER SKIN DUAL TIP RULER LAB (MISCELLANEOUS) ×2 IMPLANT
NDL SPNL 18GX3.5 QUINCKE PK (NEEDLE) ×1 IMPLANT
NEEDLE SPNL 18GX3.5 QUINCKE PK (NEEDLE) ×2 IMPLANT
PACK TOTAL JOINT (CUSTOM PROCEDURE TRAY) ×2 IMPLANT
PACK UNIVERSAL I (CUSTOM PROCEDURE TRAY) ×2 IMPLANT
PAD COLD SHLDR WRAP-ON (PAD) ×1 IMPLANT
RETRACTOR WND ALEXIS 18 MED (MISCELLANEOUS) IMPLANT
RTRCTR WOUND ALEXIS 18CM MED (MISCELLANEOUS)
SAW OSC TIP CART 19.5X105X1.3 (SAW) ×2 IMPLANT
SET HNDPC FAN SPRY TIP SCT (DISPOSABLE) ×1 IMPLANT
STAPLER VISISTAT 35W (STAPLE) IMPLANT
STEM FEM ACTIS HIGH SZ2 (Stem) ×1 IMPLANT
SUT ETHIBOND 2 V 37 (SUTURE) ×2 IMPLANT
SUT ETHILON 2 0 FS 18 (SUTURE) ×1 IMPLANT
SUT VIC AB 0 CT1 27 (SUTURE) ×1
SUT VIC AB 0 CT1 27XBRD ANBCTR (SUTURE) ×1 IMPLANT
SUT VIC AB 1 CTX 36 (SUTURE) ×1
SUT VIC AB 1 CTX36XBRD ANBCTR (SUTURE) ×1 IMPLANT
SUT VIC AB 2-0 CT1 (SUTURE) ×1 IMPLANT
SUT VIC AB 2-0 CT1 27 (SUTURE) ×2
SUT VIC AB 2-0 CT1 TAPERPNT 27 (SUTURE) ×2 IMPLANT
SYR 50ML LL SCALE MARK (SYRINGE) ×2 IMPLANT
TOWEL GREEN STERILE (TOWEL DISPOSABLE) ×2 IMPLANT
TRAY CATH 16FR W/PLASTIC CATH (SET/KITS/TRAYS/PACK) IMPLANT
TRAY FOLEY W/BAG SLVR 16FR (SET/KITS/TRAYS/PACK) ×1
TRAY FOLEY W/BAG SLVR 16FR ST (SET/KITS/TRAYS/PACK) ×1 IMPLANT
YANKAUER SUCT BULB TIP NO VENT (SUCTIONS) ×2 IMPLANT

## 2022-01-30 NOTE — Anesthesia Procedure Notes (Signed)
Spinal ? ?Patient location during procedure: OR ?Start time: 01/30/2022 11:52 AM ?End time: 01/30/2022 11:55 AM ?Reason for block: surgical anesthesia ?Staffing ?Performed: anesthesiologist  ?Anesthesiologist: Santa Lighter, MD ?Preanesthetic Checklist ?Completed: patient identified, IV checked, risks and benefits discussed, surgical consent, monitors and equipment checked, pre-op evaluation and timeout performed ?Spinal Block ?Patient position: sitting ?Prep: DuraPrep and site prepped and draped ?Patient monitoring: continuous pulse ox and blood pressure ?Approach: midline ?Location: L3-4 ?Injection technique: single-shot ?Needle ?Needle type: Pencan  ?Needle gauge: 24 G ?Assessment ?Sensory level: T6 ?Events: CSF return ?Additional Notes ?Functioning IV was confirmed and monitors were applied. Sterile prep and drape, including hand hygiene, mask and sterile gloves were used. The patient was positioned and the spine was prepped. The skin was anesthetized with lidocaine.  Free flow of clear CSF was obtained prior to injecting local anesthetic into the CSF.  The spinal needle aspirated freely following injection.  The needle was carefully withdrawn.  The patient tolerated the procedure well. Consent was obtained prior to procedure with all questions answered and concerns addressed. Risks including but not limited to bleeding, infection, nerve damage, paralysis, failed block, inadequate analgesia, allergic reaction, high spinal, itching and headache were discussed and the patient wished to proceed.  ? ?Hoy Morn, MD ? ? ? ?

## 2022-01-30 NOTE — Discharge Instructions (Signed)

## 2022-01-30 NOTE — Anesthesia Postprocedure Evaluation (Signed)
Anesthesia Post Note ? ?Patient: Wolfgang Finigan ? ?Procedure(s) Performed: LEFT TOTAL HIP ARTHROPLASTY ANTERIOR APPROACH (Left: Hip) ? ?  ? ?Patient location during evaluation: PACU ?Anesthesia Type: Spinal ?Level of consciousness: awake, awake and alert and oriented ?Pain management: pain level controlled ?Vital Signs Assessment: post-procedure vital signs reviewed and stable ?Respiratory status: spontaneous breathing, nonlabored ventilation and respiratory function stable ?Cardiovascular status: blood pressure returned to baseline and stable ?Postop Assessment: no headache, no backache, spinal receding and no apparent nausea or vomiting ?Anesthetic complications: no ? ? ?No notable events documented. ? ?Last Vitals:  ?Vitals:  ? 01/30/22 1559 01/30/22 2037  ?BP: 140/85 122/71  ?Pulse: 78 (!) 103  ?Resp: 20 18  ?Temp: 36.6 ?C 37 ?C  ?SpO2: 100% 99%  ?  ?Last Pain:  ?Vitals:  ? 01/30/22 2037  ?TempSrc: Oral  ?PainSc:   ? ? ?  ?  ?  ?  ?  ?  ? ?Santa Lighter ? ? ? ? ?

## 2022-01-30 NOTE — H&P (Signed)
? ? ?PREOPERATIVE H&P ? ?Chief Complaint: left hip degenerative joint disease ? ?HPI: ?Jesus Allen is a 51 y.o. male who presents for surgical treatment of left hip degenerative joint disease.  He denies any changes in medical history. ? ?Past Medical History:  ?Diagnosis Date  ? ED (erectile dysfunction)   ? Hyperlipidemia   ? Obesity   ? Pneumonia   ? ?Past Surgical History:  ?Procedure Laterality Date  ? FOOT FRACTURE SURGERY Left   ? FRACTURE SURGERY    ? ?Social History  ? ?Socioeconomic History  ? Marital status: Married  ?  Spouse name: Not on file  ? Number of children: Not on file  ? Years of education: Not on file  ? Highest education level: Not on file  ?Occupational History  ? Not on file  ?Tobacco Use  ? Smoking status: Never  ? Smokeless tobacco: Never  ?Vaping Use  ? Vaping Use: Never used  ?Substance and Sexual Activity  ? Alcohol use: Yes  ?  Alcohol/week: 4.0 standard drinks  ?  Types: 4 Shots of liquor per week  ?  Comment: "social"  ? Drug use: No  ? Sexual activity: Not on file  ?Other Topics Concern  ? Not on file  ?Social History Narrative  ? Works as Engineer, building services   ? Has his MBA  ? Married   ? Two children ( 13 and 17)   ? ?Social Determinants of Health  ? ?Financial Resource Strain: Not on file  ?Food Insecurity: Not on file  ?Transportation Needs: Not on file  ?Physical Activity: Not on file  ?Stress: Not on file  ?Social Connections: Not on file  ? ?Family History  ?Problem Relation Age of Onset  ? Ovarian cancer Mother   ? Diabetes Father   ? Blindness Father   ? Stroke Maternal Grandmother   ? Colon cancer Neg Hx   ? Colon polyps Neg Hx   ? Esophageal cancer Neg Hx   ? Stomach cancer Neg Hx   ? Rectal cancer Neg Hx   ? ?No Known Allergies ?Prior to Admission medications   ?Medication Sig Start Date End Date Taking? Authorizing Provider  ?aspirin EC 81 MG tablet Take 1 tablet (81 mg total) by mouth 2 (two) times daily. To be taken after surgery to prevent blood clots  01/26/22   Aundra Dubin, PA-C  ?docusate sodium (COLACE) 100 MG capsule Take 1 capsule (100 mg total) by mouth daily as needed. 01/26/22 01/26/23  Aundra Dubin, PA-C  ?ibuprofen (ADVIL) 800 MG tablet Take 800 mg by mouth every 8 (eight) hours as needed for mild pain or moderate pain. 01/14/22  Yes [provider]  ?ketoconazole (NIZORAL) 2 % cream Apply 1 application. topically daily. 03/26/20  Yes [provider]  ?ketoconazole (NIZORAL) 2 % shampoo Apply 1 application. topically 2 (two) times a week. 03/26/20  Yes [provider]  ?methocarbamol (ROBAXIN) 500 MG tablet Take 1 tablet (500 mg total) by mouth 2 (two) times daily as needed. 01/26/22   Aundra Dubin, PA-C  ?Multiple Vitamins-Minerals (MULTIVITAMIN WITH MINERALS) tablet Take 1 tablet by mouth daily. One a day men   Yes [provider]  ?ondansetron (ZOFRAN) 4 MG tablet Take 1 tablet (4 mg total) by mouth every 8 (eight) hours as needed for nausea or vomiting. 01/26/22   Aundra Dubin, PA-C  ?oxyCODONE-acetaminophen (PERCOCET) 5-325 MG tablet Take 1-2 tablets by mouth every 6 (six) hours as needed.  To be taken after surgery 01/26/22   Aundra Dubin, PA-C  ?rosuvastatin (CRESTOR) 5 MG tablet Take 1 tablet (5 mg total) by mouth daily. 04/13/21  Yes Nafziger, Tommi Rumps, NP  ?sildenafil (VIAGRA) 100 MG tablet Take 100 mg by mouth daily as needed for erectile dysfunction.   Yes Kathie Rhodes, MD  ?testosterone cypionate (DEPOTESTOSTERONE CYPIONATE) 200 MG/ML injection Inject 200 mg into the skin every 14 (fourteen) days. 01/20/22  Yes [provider]  ?tirzepatide Darcel Bayley) 12.5 MG/0.5ML Pen Inject 12.5 mg into the skin once a week. 12/13/21  Yes Dorothyann Peng, NP  ? ? ? ?Positive ROS: All other systems have been reviewed and were otherwise negative with the exception of those mentioned in the HPI and as above. ? ?Physical Exam: ?General: Alert, no acute distress ?Cardiovascular: No pedal edema ?Respiratory: No  cyanosis, no use of accessory musculature ?GI: abdomen soft ?Skin: No lesions in the area of chief complaint ?Neurologic: Sensation intact distally ?Psychiatric: Patient is competent for consent with normal mood and affect ?Lymphatic: no lymphedema ? ?MUSCULOSKELETAL: exam stable ? ?Assessment: ?left hip degenerative joint disease ? ?Plan: ?Plan for Procedure(s): ?LEFT TOTAL HIP ARTHROPLASTY ANTERIOR APPROACH ? ?The risks benefits and alternatives were discussed with the patient including but not limited to the risks of nonoperative treatment, versus surgical intervention including infection, bleeding, nerve injury,  blood clots, cardiopulmonary complications, morbidity, mortality, among others, and they were willing to proceed.  ? ?Preoperative templating of the joint replacement has been completed, documented, and submitted to the Operating Room personnel in order to optimize intra-operative equipment management. ? ? ?Eduard Roux, MD ?01/30/2022 ?10:52 AM ? ?

## 2022-01-30 NOTE — Transfer of Care (Signed)
Immediate Anesthesia Transfer of Care Note ? ?Patient: Jesus Allen ? ?Procedure(s) Performed: LEFT TOTAL HIP ARTHROPLASTY ANTERIOR APPROACH (Left: Hip) ? ?Patient Location: PACU ? ?Anesthesia Type:MAC and Spinal ? ?Level of Consciousness: awake, alert  and oriented ? ?Airway & Oxygen Therapy: Patient Spontanous Breathing ? ?Post-op Assessment: Report given to RN and Post -op Vital signs reviewed and stable ? ?Post vital signs: Reviewed and stable ? ?Last Vitals:  ?Vitals Value Taken Time  ?BP 96/64   ?Temp    ?Pulse 94 01/30/22 1354  ?Resp 11 01/30/22 1354  ?SpO2 98 % 01/30/22 1354  ?Vitals shown include unvalidated device data. ? ?Last Pain:  ?Vitals:  ? 01/30/22 1026  ?TempSrc:   ?PainSc: 0-No pain  ?   ? ?  ? ?Complications: No notable events documented. ?

## 2022-01-30 NOTE — Evaluation (Signed)
Physical Therapy Evaluation ?Patient Details ?Name: Jesus Allen ?MRN: [id number] ?DOB: 1971/10/10 ?Today's Date: 01/30/2022 ? ?History of Present Illness ? Pt adm 3/20 for lt THR. PMH - DJD  ?Clinical Impression ? Pt performing as expected after lt THR and able to amb in hallway with walker and supervision. Expect pt will continue to make excellent progress and plans to return home with wife.    ?   ? ?Recommendations for follow up therapy are one component of a multi-disciplinary discharge planning process, led by the attending physician.  Recommendations may be updated based on patient status, additional functional criteria and insurance authorization. ? ?Follow Up Recommendations Follow physician's recommendations for discharge plan and follow up therapies ? ?  ?Assistance Recommended at Discharge Intermittent Supervision/Assistance  ?Patient can return home with the following ? Help with stairs or ramp for entrance ? ?  ?Equipment Recommendations Rolling walker (2 wheels)  ?Recommendations for Other Services ?    ?  ?Functional Status Assessment Patient has had a recent decline in their functional status and demonstrates the ability to make significant improvements in function in a reasonable and predictable amount of time.  ? ?  ?Precautions / Restrictions Restrictions ?Weight Bearing Restrictions: Yes ?LLE Weight Bearing: Weight bearing as tolerated  ? ?  ? ?Mobility ? Bed Mobility ?Overal bed mobility: Needs Assistance ?Bed Mobility: Supine to Sit ?  ?  ?Supine to sit: Min assist ?  ?  ?General bed mobility comments: Assist to bring LLE off of bed ?  ? ?Transfers ?Overall transfer level: Needs assistance ?Equipment used: Rolling walker (2 wheels) ?Transfers: Sit to/from Stand ?Sit to Stand: Min guard ?  ?  ?  ?  ?  ?General transfer comment: Assist for safety and lines. Verbal cues for hand placement ?  ? ?Ambulation/Gait ?Ambulation/Gait assistance: Supervision ?Gait Distance (Feet): 150  Feet ?Assistive device: Rolling walker (2 wheels) ?Gait Pattern/deviations: Step-through pattern, Decreased stance time - left ?Gait velocity: decr ?Gait velocity interpretation: 1.31 - 2.62 ft/sec, indicative of limited community ambulator ?  ?General Gait Details: Assist for safety and lines ? ?Stairs ?  ?  ?  ?  ?  ? ?Wheelchair Mobility ?  ? ?Modified Rankin (Stroke Patients Only) ?  ? ?  ? ?Balance Overall balance assessment: No apparent balance deficits (not formally assessed) ?  ?  ?  ?  ?  ?  ?  ?  ?  ?  ?  ?  ?  ?  ?  ?  ?  ?  ?   ? ? ? ?Pertinent Vitals/Pain Pain Assessment ?Pain Assessment: Faces ?Faces Pain Scale: Hurts little more ?Pain Location: lt hip ?Pain Descriptors / Indicators: Grimacing, Operative site guarding ?Pain Intervention(s): Limited activity within patient's tolerance  ? ? ?Home Living Family/patient expects to be discharged to:: Private residence ?Living Arrangements: Spouse/significant other;Children ?Available Help at Discharge: Family;Available 24 hours/day ?Type of Home: House ?Home Access: Stairs to enter ?Entrance Stairs-Rails: Right;None (from garage no rail, in front rail) ?Entrance Stairs-Number of Steps: 2-3 ?Alternate Level Stairs-Number of Steps: flight ?Home Layout: Two level;Able to live on main level with bedroom/bathroom ?Home Equipment: None ?   ?  ?Prior Function Prior Level of Function : Independent/Modified Independent;Driving;Working/employed ?  ?  ?  ?  ?  ?  ?Mobility Comments: No device ?  ?  ? ? ?Hand Dominance  ?   ? ?  ?Extremity/Trunk Assessment  ? Upper Extremity Assessment ?Upper Extremity Assessment: Overall WFL for  tasks assessed ?  ? ?Lower Extremity Assessment ?Lower Extremity Assessment: LLE deficits/detail ?LLE Deficits / Details: limited by expected post op soreness ?  ? ?   ?Communication  ? Communication: No difficulties  ?Cognition Arousal/Alertness: Awake/alert ?Behavior During Therapy: [facility] for tasks assessed/performed ?Overall Cognitive Status:  Within Functional Limits for tasks assessed ?  ?  ?  ?  ?  ?  ?  ?  ?  ?  ?  ?  ?  ?  ?  ?  ?  ?  ?  ? ?  ?General Comments   ? ?  ?Exercises    ? ?Assessment/Plan  ?  ?PT Assessment Patient needs continued PT services  ?PT Problem List Decreased strength;Decreased mobility;Pain ? ?   ?  ?PT Treatment Interventions DME instruction;Gait training;Stair training;Functional mobility training;Therapeutic activities;Therapeutic exercise;Patient/family education   ? ?PT Goals (Current goals can be found in the Care Plan section)  ?Acute Rehab PT Goals ?Patient Stated Goal: return home ?PT Goal Formulation: With patient ?Time For Goal Achievement: 02/02/22 ?Potential to Achieve Goals: Good ? ?  ?Frequency 7X/week ?  ? ? ?Co-evaluation   ?  ?  ?  ?  ? ? ?  ?AM-PAC PT "6 Clicks" Mobility  ?Outcome Measure Help needed turning from your back to your side while in a flat bed without using bedrails?: A Little ?Help needed moving from lying on your back to sitting on the side of a flat bed without using bedrails?: A Little ?Help needed moving to and from a bed to a chair (including a wheelchair)?: A Little ?Help needed standing up from a chair using your arms (e.g., wheelchair or bedside chair)?: A Little ?Help needed to walk in hospital room?: A Little ?Help needed climbing 3-5 steps with a railing? : A Little ?6 Click Score: 18 ? ?  ?End of Session Equipment Utilized During Treatment: Gait belt ?Activity Tolerance: Patient tolerated treatment well ?Patient left: in chair;with call bell/phone within reach;with family/visitor present ?Nurse Communication: Mobility status ?PT Visit Diagnosis: Other abnormalities of gait and mobility (R26.89) ?  ? ?Time: 2751-7001 ?PT Time Calculation (min) (ACUTE ONLY): 20 min ? ? ?Charges:   PT Evaluation ?$PT Eval Low Complexity: 1 Low ?  ?  ?   ? ? ?[facility] PT ?Acute Rehabilitation Services ?Pager [phone] ?Office [phone] ? ? ?[facility] ?01/30/2022, 5:43 PM ? ?

## 2022-01-30 NOTE — Anesthesia Procedure Notes (Signed)
Procedure Name: [place] ?Date/Time: 01/30/2022 11:50 AM ?Performed by: Harden Mo, CRNA ?Pre-anesthesia Checklist: Patient identified, Emergency Drugs available, Suction available and Patient being monitored ?Patient Re-evaluated:Patient Re-evaluated prior to induction ?Oxygen Delivery Method: Simple face mask ?Preoxygenation: Pre-oxygenation with 100% oxygen ?Induction Type: IV induction ?Placement Confirmation: positive ETCO2 and breath sounds checked- equal and bilateral ?Dental Injury: Teeth and Oropharynx as per pre-operative assessment  ? ? ? ? ?

## 2022-01-30 NOTE — Anesthesia Preprocedure Evaluation (Addendum)
Anesthesia Evaluation  ?Patient identified by MRN, date of birth, ID band ?Patient awake ? ? ? ?Reviewed: ?Allergy & Precautions, NPO status , Patient's Chart, lab work & pertinent test results ? ?Airway ?Mallampati: II ? ?TM Distance: >3 FB ?Neck ROM: Full ? ? ? Dental ? ?(+) Teeth Intact, Dental Advisory Given ?  ?Pulmonary ?neg pulmonary ROS,  ?  ?Pulmonary exam normal ?breath sounds clear to auscultation ? ? ? ? ? ? Cardiovascular ?Exercise Tolerance: Good ?(-) hypertension(-) Past MI negative cardio ROS ?Normal cardiovascular exam ?Rhythm:Regular Rate:Normal ? ? ?  ?Neuro/Psych ?negative neurological ROS ? negative psych ROS  ? GI/Hepatic ?negative GI ROS, Neg liver ROS,   ?Endo/Other  ?Obesity ? ? Renal/GU ?negative Renal ROS  ? ?  ?Musculoskeletal ? ?(+) Arthritis , left hip degenerative joint disease  ? Abdominal ?  ?Peds ? Hematology ?negative hematology ROS ?(+) Plt 282k   ?Anesthesia Other Findings ?Day of surgery medications reviewed with the patient. ? Reproductive/Obstetrics ? ?  ? ? ? ? ? ? ? ? ? ? ? ? ? ?  ?  ? ? ? ? ? ? ? ?Anesthesia Physical ?Anesthesia Plan ? ?ASA: 2 ? ?Anesthesia Plan: Spinal  ? ?Post-op Pain Management: Tylenol PO (pre-op)*  ? ?Induction: Intravenous ? ?PONV Risk Score and Plan: 1 and Midazolam, Dexamethasone, Ondansetron and TIVA ? ?Airway Management Planned: Natural Airway and Nasal Cannula ? ?Additional Equipment:  ? ?Intra-op Plan:  ? ?Post-operative Plan:  ? ?Informed Consent: I have reviewed the patients History and Physical, chart, labs and discussed the procedure including the risks, benefits and alternatives for the proposed anesthesia with the patient or authorized representative who has indicated his/her understanding and acceptance.  ? ? ? ?Dental advisory given ? ?Plan Discussed with: CRNA, Anesthesiologist and Surgeon ? ?Anesthesia Plan Comments:   ? ? ? ? ? ?Anesthesia Quick Evaluation ? ?

## 2022-01-30 NOTE — TOC Transition Note (Addendum)
Transition of Care (TOC) - CM/SW Discharge Note ? ? ?Patient Details  ?Name: Jesus Allen ?MRN: [id number] ?Date of Birth: [birth year] ? ?Transition of Care (TOC) CM/SW Contact:  ?Marilu Favre, RN ?Phone Number: ?01/30/2022, 4:30 PM ? ? ?Clinical Narrative:    ? ?[facility] received referral from Dr Erlinda Hong office for [name]. Marjory Lies with [facility] unable to accept patient .  ? ?NCM left message with Tommi Rumps with [facility] awaiting call back.  ? ?Cory with [facility] accepted for HHPT. ?[facility] staff will provide any needed DME.  ? ? ?Transition of Care (TOC) Screening Note ? ? ?Patient Details  ?Name: Jesus Allen ?Date of Birth: [birth year] ? ? ?Transition of Care (TOC) CM/SW Contact:    ?Marilu Favre, RN ?Phone Number: ?01/30/2022, 4:38 PM ? ? ? ?Transition of Care Department [facility]) has reviewed patient and no TOC needs have been identified at this time. We will continue to monitor patient advancement through interdisciplinary progression rounds. If new patient transition needs arise, please place a TOC consult. ?  ?  ?  ? ? ?Patient Goals and CMS Choice ?  ?  ?  ? ?Discharge Placement ?  ?           ?  ?  ?  ?  ? ?Discharge Plan and Services ?  ?  ?           ?  ?  ?  ?  ?  ?  ?  ?  ?  ?  ? ?Social Determinants of Health (SDOH) Interventions ?  ? ? ?Readmission Risk Interventions ?No flowsheet data found. ? ? ? ? ?

## 2022-01-30 NOTE — Op Note (Signed)
? ?LEFT TOTAL HIP ARTHROPLASTY ANTERIOR APPROACH  Procedure Note ?Talmage Coin Memmer   [id number] ? ?Pre-op Diagnosis: left hip degenerative joint disease ?    ?Post-op Diagnosis: same ?  ?Operative Procedures  ?1. Total hip replacement; Left hip; uncemented [id number]  ? ?Surgeon: Frankey Shown, M.D. ? ?Assist: Madalyn Rob, PA-C ?  ?Anesthesia: spinal ? ?Prosthesis: Depuy ?Acetabulum: Pinnacle 54 mm ?Femur: Actis 2 HO ?Head: 36 mm size: +1.5 ?Liner: +4 ?Bearing Type: ceramic/poly ? ?Total Hip Arthroplasty (Anterior Approach) Op Note:  ?After informed consent was obtained and the operative extremity marked in the holding area, the patient was brought back to the operating room and placed supine on the HANA table. Next, the operative extremity was prepped and draped in normal sterile fashion. Surgical timeout occurred verifying patient identification, surgical site, surgical procedure and administration of antibiotics.  ?A modified anterior Smith-Peterson approach to the hip was performed, using the interval between tensor fascia lata and sartorius.  Dissection was carried bluntly down onto the anterior hip capsule. The lateral femoral circumflex vessels were identified and coagulated. A capsulotomy was performed and the capsular flaps tagged for later repair.  The neck osteotomy was performed. The femoral head was removed which showed severe wear, the acetabular rim was cleared of soft tissue and attention was turned to reaming the acetabulum.  ?Sequential reaming was performed under fluoroscopic guidance. We reamed to a size 53 mm, and then impacted the acetabular shell.  The liner was then placed after irrigation and attention turned to the femur.  ?After placing the femoral hook, the leg was taken to externally rotated, extended and adducted position taking care to perform soft tissue releases to allow for adequate mobilization of the femur. Soft tissue was cleared from the shoulder of the greater  trochanter and the hook elevator used to improve exposure of the proximal femur. Sequential broaching performed up to a size 2 with excellent fit. Trial neck and head were placed. The leg was brought back up to neutral and the construct reduced.  Antibiotic irrigation was placed in the surgical wound and kept for at least 1 minute.  The position and sizing of components, offset and leg lengths were checked using fluoroscopy. Stability of the construct was checked in extension and external rotation without any subluxation or impingement of prosthesis. We dislocated the prosthesis, dropped the leg back into position, removed trial components, and irrigated copiously. The final stem and head was then placed, the leg brought back up, the system reduced and fluoroscopy used to verify positioning.  ?We irrigated, obtained hemostasis and closed the capsule using #2 ethibond suture.  One gram of vancomycin powder was placed in the surgical bed.   One gram of topical tranexamic acid was injected into the joint.  The fascia was closed with #1 vicryl plus, the deep fat layer was closed with 0 vicryl, the subcutaneous layers closed with 2.0 Vicryl Plus and the skin closed with 2.0 nylon and dermabond. A sterile dressing was applied. The patient was awakened in the operating room and taken to recovery in stable condition.  ?All sponge, needle, and instrument counts were correct at the end of the case.  ? ?Tawanna Cooler, my PA, was a medical necessity for opening, closing, limb positioning, retracting, exposing, and overall facilitation and timely completion of the surgery. ? ?Position: supine  ?Complications: see description of procedure.  ?Time Out: performed  ? ?Drains/Packing: none ? ?Estimated blood loss: see anesthesia record ? ?Returned to Recovery Room: in  good condition.  ? ?Antibiotics: yes  ? ?Mechanical VTE (DVT) Prophylaxis: sequential compression devices, TED thigh-high  ?Chemical VTE (DVT) Prophylaxis: aspirin   ? ?Fluid Replacement: see anesthesia record ? ?Specimens Removed: 1 to pathology  ? ?Sponge and Instrument Count Correct? yes  ? ?PACU: portable radiograph - low AP  ? ?Plan/RTC: Return in 2 weeks for staple removal. ?Weight Bearing/Load Lower Extremity: full  ?Hip precautions: none ?Suture Removal: 2 weeks  ? ?N. Eduard Roux, MD ?[facility] ?1:24 PM ? ? ?Implant Name Type Inv. Item Serial No. Manufacturer Lot No. LRB No. Used Action  ?Enzo Montgomery 54MM - [id number] Mulga 54MM  [place] [id number] Left 1 Implanted  ?LINER NEUTRAL 54X36MM PLUS 4 - [id number] Hips LINER NEUTRAL 54X36MM PLUS 4  [employer] [id number] Left 1 Implanted  ?STEM FEM ACTIS HIGH SZ2 - [id number] Stem STEM FEM ACTIS HIGH SZ2  [employer] [id number] Left 1 Implanted  ? ? ?

## 2022-01-31 DIAGNOSIS — Z79899 Other long term (current) drug therapy: Secondary | ICD-10-CM | POA: Diagnosis not present

## 2022-01-31 DIAGNOSIS — Z7982 Long term (current) use of aspirin: Secondary | ICD-10-CM | POA: Diagnosis not present

## 2022-01-31 DIAGNOSIS — M1612 Unilateral primary osteoarthritis, left hip: Secondary | ICD-10-CM | POA: Diagnosis not present

## 2022-01-31 LAB — BASIC METABOLIC PANEL
Anion gap: 10 (ref 5–15)
BUN: 6 mg/dL (ref 6–20)
CO2: 23 mmol/L (ref 22–32)
Calcium: 8.8 mg/dL — ABNORMAL LOW (ref 8.9–10.3)
Chloride: 101 mmol/L (ref 98–111)
Creatinine, Ser: 1.05 mg/dL (ref 0.61–1.24)
GFR, Estimated: >60 mL/min (ref >60)
Glucose, Bld: 135 mg/dL — ABNORMAL HIGH (ref 70–99)
Potassium: 3.7 mmol/L (ref 3.5–5.1)
Sodium: 134 mmol/L — ABNORMAL LOW (ref 135–145)

## 2022-01-31 LAB — CBC
HCT: 40.7 % (ref 39.0–52.0)
Hemoglobin: 14.2 g/dL (ref 13.0–17.0)
MCH: 32.7 pg (ref 26.0–34.0)
MCHC: 34.9 g/dL (ref 30.0–36.0)
MCV: 93.8 fL (ref 80.0–100.0)
Platelets: 287 10*3/uL (ref 150–400)
RBC: 4.34 MIL/uL (ref 4.22–5.81)
RDW: 13 % (ref 11.5–15.5)
WBC: 7.8 10*3/uL (ref 4.0–10.5)
nRBC: 0 % (ref 0.0–0.2)

## 2022-01-31 NOTE — Progress Notes (Signed)
Physical Therapy Treatment ?Patient Details ?Name: Jesus Allen ?MRN: [id number] ?DOB: [birth year] ?Today's Date: 01/31/2022 ? ? ?History of Present Illness Pt adm 3/20 for lt THR. PMH - DJD ? ?  ?PT Comments  ? ? Patient progressing well towards PT goals. Reports episode of "hot flash" upon PT arrival. Session focused on gait training, stair training and standing exercises. Requires Min A for stair negotiation with therapist stabilizing RW as pt ascends stairs backwards. Provides handout and reviews there ex. Education on positioning, mobility expectations, stairs, car transfer, exercise etc. Pt plans to d/c home today. ?   ?Recommendations for follow up therapy are one component of a multi-disciplinary discharge planning process, led by the attending physician.  Recommendations may be updated based on patient status, additional functional criteria and insurance authorization. ? ?Follow Up Recommendations ? Follow physician's recommendations for discharge plan and follow up therapies ?  ?  ?Assistance Recommended at Discharge PRN  ?Patient can return home with the following Help with stairs or ramp for entrance ?  ?Equipment Recommendations ? Rolling walker (2 wheels)  ?  ?Recommendations for Other Services   ? ? ?  ?Precautions / Restrictions Precautions ?Precautions: None ?Restrictions ?Weight Bearing Restrictions: Yes ?LLE Weight Bearing: Weight bearing as tolerated  ?  ? ?Mobility ? Bed Mobility ?Overal bed mobility: Needs Assistance ?Bed Mobility: Supine to Sit ?  ?  ?Supine to sit: Supervision, HOB elevated ?  ?  ?General bed mobility comments: NO assist needed. ?  ? ?Transfers ?Overall transfer level: Needs assistance ?Equipment used: Rolling walker (2 wheels) ?Transfers: Sit to/from Stand ?Sit to Stand: Supervision ?  ?  ?  ?  ?  ?General transfer comment: Supervision for safety. Stood from [employer]. ?  ? ?Ambulation/Gait ?Ambulation/Gait assistance: Supervision ?Gait Distance (Feet): 170  Feet ?Assistive device: Rolling walker (2 wheels) ?Gait Pattern/deviations: Step-through pattern, Decreased stance time - left ?  ?Gait velocity interpretation: 1.31 - 2.62 ft/sec, indicative of limited community ambulator ?  ?General Gait Details: Slow, steady gait with cues for activate glutes during stance and for proper gait mechanics. ? ? ?Stairs ?Stairs: Yes ?Stairs assistance: Min assist ?Stair Management: Backwards, Step to pattern, With walker ?Number of Stairs: 2 ?General stair comments: Cues for technique/safety with therapist holding RW to stabilize as pt ascends backwards. ? ? ?Wheelchair Mobility ?  ? ?Modified Rankin (Stroke Patients Only) ?  ? ? ?  ?Balance Overall balance assessment: No apparent balance deficits (not formally assessed) ?  ?  ?  ?  ?  ?  ?  ?  ?  ?  ?  ?  ?  ?  ?  ?  ?  ?  ?  ? ?  ?Cognition Arousal/Alertness: Awake/alert ?Behavior During Therapy: [facility] for tasks assessed/performed ?Overall Cognitive Status: Within Functional Limits for tasks assessed ?  ?  ?  ?  ?  ?  ?  ?  ?  ?  ?  ?  ?  ?  ?  ?  ?  ?  ?  ? ?  ?Exercises Total Joint Exercises ?Hip ABduction/ADduction: Strengthening, Both, 5 reps, Standing ?Knee Flexion: AROM, Both, 5 reps, Standing ?Marching in Standing: Strengthening, Both, 5 reps, Standing ?Standing Hip Extension: Strengthening, Both, 5 reps, Standing ? ?  ?General Comments General comments (skin integrity, edema, etc.): Pt reports "hot flash" upon PT arrival. ?  ?  ? ?Pertinent Vitals/Pain Pain Assessment ?Pain Assessment: 0-10 ?Pain Score: 3  ?Pain Location: lt hip ?Pain  Descriptors / Indicators: Grimacing, Operative site guarding ?Pain Intervention(s): Monitored during session  ? ? ?Home Living   ?  ?  ?  ?  ?  ?  ?  ?  ?  ?   ?  ?Prior Function    ?  ?  ?   ? ?PT Goals (current goals can now be found in the care plan section) Progress towards PT goals: Progressing toward goals ? ?  ?Frequency ? ? ? 7X/week ? ? ? ?  ?PT Plan Current plan remains appropriate   ? ? ?Co-evaluation   ?  ?  ?  ?  ? ?  ?AM-PAC PT "6 Clicks" Mobility   ?Outcome Measure ? Help needed turning from your back to your side while in a flat bed without using bedrails?: None ?Help needed moving from lying on your back to sitting on the side of a flat bed without using bedrails?: A Little ?Help needed moving to and from a bed to a chair (including a wheelchair)?: A Little ?Help needed standing up from a chair using your arms (e.g., wheelchair or bedside chair)?: A Little ?Help needed to walk in hospital room?: A Little ?Help needed climbing 3-5 steps with a railing? : A Little ?6 Click Score: 19 ? ?  ?End of Session Equipment Utilized During Treatment: Gait belt ?Activity Tolerance: Patient tolerated treatment well ?Patient left: in bed;with call bell/phone within reach ?Nurse Communication: Mobility status ?PT Visit Diagnosis: Other abnormalities of gait and mobility (R26.89) ?  ? ? ?Time: 9735-3299 ?PT Time Calculation (min) (ACUTE ONLY): 18 min ? ?Charges:  $Gait Training: 8-22 mins          ?          ? ?Marisa Severin, PT, DPT ?Acute Rehabilitation Services ?Pager [phone] ?Office [phone] ? ? ? ? ? ?[place] ?01/31/2022, 9:23 AM ? ?

## 2022-01-31 NOTE — Discharge Summary (Signed)
Patient ID: ?Jesus Allen ?MRN: [id number] ?DOB/AGE: Nov 28, 1970 51 y.o. ? ?Admit date: 01/30/2022 ?Discharge date: 01/31/2022 ? ?Admission Diagnoses:  ?Principal Problem: ?  Primary osteoarthritis of left hip ?Active Problems: ?  Status post total replacement of left hip ? ? ?Discharge Diagnoses:  ?Same ? ?Past Medical History:  ?Diagnosis Date  ? ED (erectile dysfunction)   ? Hyperlipidemia   ? Obesity   ? Pneumonia   ? ? ?Surgeries: Procedure(s): ?LEFT TOTAL HIP ARTHROPLASTY ANTERIOR APPROACH on 01/30/2022 ?  ?Consultants:  ? ?Discharged Condition: Improved ? ?Hospital Course: Jesus Allen is an 51 y.o. male who was admitted 01/30/2022 for operative treatment ofPrimary osteoarthritis of left hip. Patient has severe unremitting pain that affects sleep, daily activities, and work/hobbies. After pre-op clearance the patient was taken to the operating room on 01/30/2022 and underwent  Procedure(s): ?LEFT TOTAL HIP ARTHROPLASTY ANTERIOR APPROACH.   ? ?Patient was given perioperative antibiotics:  ?Anti-infectives (From admission, onward)  ? ? Start     Dose/Rate Route Frequency Ordered Stop  ? 01/30/22 2000  ceFAZolin (ANCEF) IVPB 2g/100 mL premix       ? 2 g ?200 mL/hr over 30 Minutes Intravenous Every 6 hours 01/30/22 1540 01/31/22 0216  ? 01/30/22 1225  vancomycin (VANCOCIN) powder  Status:  Discontinued       ?   As needed 01/30/22 1225 01/30/22 1349  ? 01/30/22 1000  ceFAZolin (ANCEF) IVPB 2g/100 mL premix       ? 2 g ?200 mL/hr over 30 Minutes Intravenous On call to O.R. 01/30/22 0950 01/30/22 1210  ? ?  ?  ? ?Patient was given sequential compression devices, early ambulation, and chemoprophylaxis to prevent DVT. ? ?Patient benefited maximally from hospital stay and there were no complications.   ? ?Recent vital signs: Patient Vitals for the past 24 hrs: ? BP Temp Temp src Pulse Resp SpO2 Height Weight  ?01/31/22 0728 (!) 127/94 98.3 ?F (36.8 ?C) Oral (!) 102 18 98 % -- --  ?01/31/22 0401  131/89 98.9 ?F (37.2 ?C) Oral (!) 108 20 100 % -- --  ?01/30/22 2237 125/86 98.4 ?F (36.9 ?C) Oral 100 20 100 % -- --  ?01/30/22 2037 122/71 98.6 ?F (37 ?C) Oral (!) 103 18 99 % -- --  ?01/30/22 1559 140/85 97.9 ?F (36.6 ?C) -- 78 20 100 % -- --  ?01/30/22 1500 -- 97.6 ?F (36.4 ?C) -- 73 12 100 % -- --  ?01/30/22 1455 123/90 -- -- 77 15 100 % -- --  ?01/30/22 1440 117/76 -- -- 78 19 100 % -- --  ?01/30/22 1425 108/61 -- -- 78 15 100 % -- --  ?01/30/22 1410 110/78 -- -- 88 13 100 % -- --  ?01/30/22 1355 96/64 97.7 ?F (36.5 ?C) -- 97 17 99 % -- --  ?01/30/22 1014 (!) 158/109 97.9 ?F (36.6 ?C) Oral (!) 117 18 96 % '5\' 10"'$  (1.778 m) 113.4 kg  ?  ? ?Recent laboratory studies: No results for input(s): WBC, HGB, HCT, PLT, NA, K, CL, CO2, BUN, CREATININE, GLUCOSE, INR, CALCIUM in the last 72 hours. ? ?Invalid input(s): PT, 2 ? ? ?Discharge Medications:   ?Allergies as of 01/31/2022   ?No Known Allergies ?  ? ?  ?Medication List  ?  ? ?STOP taking these medications   ? ?ibuprofen 800 MG tablet ?Commonly known as: ADVIL ?  ? ?  ? ?TAKE these medications   ? ?aspirin EC 81 MG tablet ?Take 1  tablet (81 mg total) by mouth 2 (two) times daily. To be taken after surgery to prevent blood clots ?  ?docusate sodium 100 MG capsule ?Commonly known as: Colace ?Take 1 capsule (100 mg total) by mouth daily as needed. ?  ?ketoconazole 2 % cream ?Commonly known as: NIZORAL ?Apply 1 application. topically daily. ?  ?ketoconazole 2 % shampoo ?Commonly known as: NIZORAL ?Apply 1 application. topically 2 (two) times a week. ?  ?methocarbamol 500 MG tablet ?Commonly known as: Robaxin ?Take 1 tablet (500 mg total) by mouth 2 (two) times daily as needed. ?  ?multivitamin with minerals tablet ?Take 1 tablet by mouth daily. One a day men ?  ?ondansetron 4 MG tablet ?Commonly known as: Zofran ?Take 1 tablet (4 mg total) by mouth every 8 (eight) hours as needed for nausea or vomiting. ?  ?oxyCODONE-acetaminophen 5-325 MG tablet ?Commonly known as:  Percocet ?Take 1-2 tablets by mouth every 6 (six) hours as needed. To be taken after surgery ?  ?rosuvastatin 5 MG tablet ?Commonly known as: Crestor ?Take 1 tablet (5 mg total) by mouth daily. ?  ?sildenafil 100 MG tablet ?Commonly known as: VIAGRA ?Take 100 mg by mouth daily as needed for erectile dysfunction. ?  ?testosterone cypionate 200 MG/ML injection ?Commonly known as: DEPOTESTOSTERONE CYPIONATE ?Inject 200 mg into the skin every 14 (fourteen) days. ?  ?tirzepatide 12.5 MG/0.5ML Pen ?Commonly known as: MOUNJARO ?Inject 12.5 mg into the skin once a week. ?  ? ?  ? ?  ?  ? ? ?  ?Durable Medical Equipment  ?(From admission, onward)  ?  ? ? ?  ? ?  Start     Ordered  ? 01/30/22 1623  DME Walker rolling  Once       ?Question:  Patient needs a walker to treat with the following condition  Answer:  History of hip replacement  ? 01/30/22 1622  ? 01/30/22 1623  DME 3 n 1  Once       ? 01/30/22 1622  ? 01/30/22 1623  DME Bedside commode  Once       ?Question:  Patient needs a bedside commode to treat with the following condition  Answer:  History of hip replacement  ? 01/30/22 1622  ? ?  ?  ? ?  ? ? ?Diagnostic Studies: DG Pelvis Portable ? ?Result Date: 01/30/2022 ?CLINICAL DATA:  Post Joint replacement. EXAM: PORTABLE PELVIS 1-2 VIEWS COMPARISON:  Intraoperative evaluation January 30, 2022. FINDINGS: Low AP pelvis excluding the upper iliac crests shows no acute bony abnormality and postoperative changes of LEFT total hip arthroplasty in the AP projection. Gas present in the soft tissues about the LEFT hip. Mild degenerative changes are noted about the RIGHT hip. IMPRESSION: Immediate postoperative evaluation of LEFT total hip arthroplasty without unexpected findings. Electronically Signed   By: Zetta Bills M.D.   On: 01/30/2022 14:19  ? ?DG C-Arm 1-60 Min-No Report ? ?Result Date: 01/30/2022 ?Fluoroscopy was utilized by the requesting physician.  No radiographic interpretation.  ? ?DG C-Arm 1-60 Min-No  Report ? ?Result Date: 01/30/2022 ?Fluoroscopy was utilized by the requesting physician.  No radiographic interpretation.  ? ?DG HIP UNILAT WITH PELVIS 1V LEFT ? ?Result Date: 01/30/2022 ?CLINICAL DATA:  Fluoroscopic assistance for left hip arthroplasty EXAM: DG HIP (WITH OR WITHOUT PELVIS) 1V*L* COMPARISON:  12/09/2021 FINDINGS: Fluoroscopic images show left hip arthroplasty. No fracture or dislocation is seen. Fluoroscopic time was 28 seconds. Radiation dose was 5.94 mGy. IMPRESSION: Fluoroscopic assistance  was provided for left hip arthroplasty. Electronically Signed   By: Elmer Picker M.D.   On: 01/30/2022 13:33   ? ?Disposition: Discharge disposition: 01-Home or Self Care ? ? ? ? ? ? ? ? ? Follow-up Information   ? ? Leandrew Koyanagi, MD. Schedule an appointment as soon as possible for a visit in 2 week(s).   ?Specialty: Orthopedic Surgery ?Contact information: ?[address] ?[place] [address] ?[phone] ? ? ?  ?  ? ? Care, [facility] Follow up.   ?Specialty: Home Health Services ?Contact information: ?[address] ?[address] ?[place] [address] ?[phone] ? ? ?  ?  ? ?  ?  ? ?  ? ? ? ?Signed: ?Aundra Dubin ?01/31/2022, 8:11 AM ? ? ? ? ?

## 2022-01-31 NOTE — Progress Notes (Signed)
Patient alert and oriented, voiding adequately, skin clean, dry and intact without evidence of skin break down, or symptoms of complications - no redness or edema noted, only slight tenderness at site.  Patient states pain is manageable at time of discharge. Patient has an appointment with MD in 2 weeks 

## 2022-01-31 NOTE — Progress Notes (Signed)
Subjective: ?1 Day Post-Op Procedure(s) (LRB): ?LEFT TOTAL HIP ARTHROPLASTY ANTERIOR APPROACH (Left) ?Patient reports pain as mild.   ? ?Objective: ?Vital signs in last 24 hours: ?Temp:  [97.6 ?F (36.4 ?C)-98.9 ?F (37.2 ?C)] 98.3 ?F (36.8 ?C) (03/21 5631) ?Pulse Rate:  [73-117] 102 (03/21 0728) ?Resp:  [12-20] 18 (03/21 0728) ?BP: (96-158)/(61-109) 127/94 (03/21 0728) ?SpO2:  [96 %-100 %] 98 % (03/21 0728) ?Weight:  [113.4 kg] 113.4 kg (03/20 1014) ? ?Intake/Output from previous day: ?03/20 0701 - 03/21 0700 ?In: 2728.8 [P.O.:660; I.V.:1568.8; IV Piggyback:500] ?Out: 1350 [Urine:1200; Blood:150] ?Intake/Output this shift: ?No intake/output data recorded. ? ?No results for input(s): HGB in the last 72 hours. ?No results for input(s): WBC, RBC, HCT, PLT in the last 72 hours. ?No results for input(s): NA, K, CL, CO2, BUN, CREATININE, GLUCOSE, CALCIUM in the last 72 hours. ?No results for input(s): LABPT, INR in the last 72 hours. ? ?Neurologically intact ?Neurovascular intact ?Sensation intact distally ?Intact pulses distally ?Dorsiflexion/Plantar flexion intact ?Incision: dressing C/D/I ?No cellulitis present ?Compartment soft ? ? ?Assessment/Plan: ?1 Day Post-Op Procedure(s) (LRB): ?LEFT TOTAL HIP ARTHROPLASTY ANTERIOR APPROACH (Left) ?Advance diet ?Up with therapy ?D/C IV fluids ?WBAT LLE ?Awaiting labs ?D/c after second PT session as long as he continues to mobilize ? ? ? ? ? ?Aundra Dubin ?01/31/2022, 8:05 AM ? ?

## 2022-02-02 ENCOUNTER — Telehealth: Payer: Self-pay

## 2022-02-02 DIAGNOSIS — Z7985 Long-term (current) use of injectable non-insulin antidiabetic drugs: Secondary | ICD-10-CM | POA: Diagnosis not present

## 2022-02-02 DIAGNOSIS — E669 Obesity, unspecified: Secondary | ICD-10-CM | POA: Diagnosis not present

## 2022-02-02 DIAGNOSIS — N529 Male erectile dysfunction, unspecified: Secondary | ICD-10-CM | POA: Diagnosis not present

## 2022-02-02 DIAGNOSIS — Z8701 Personal history of pneumonia (recurrent): Secondary | ICD-10-CM | POA: Diagnosis not present

## 2022-02-02 DIAGNOSIS — Z6836 Body mass index (BMI) 36.0-36.9, adult: Secondary | ICD-10-CM | POA: Diagnosis not present

## 2022-02-02 DIAGNOSIS — Z96642 Presence of left artificial hip joint: Secondary | ICD-10-CM | POA: Diagnosis not present

## 2022-02-02 DIAGNOSIS — Z471 Aftercare following joint replacement surgery: Secondary | ICD-10-CM | POA: Diagnosis not present

## 2022-02-02 DIAGNOSIS — M1611 Unilateral primary osteoarthritis, right hip: Secondary | ICD-10-CM | POA: Diagnosis not present

## 2022-02-02 DIAGNOSIS — Z9181 History of falling: Secondary | ICD-10-CM | POA: Diagnosis not present

## 2022-02-02 DIAGNOSIS — Z7982 Long term (current) use of aspirin: Secondary | ICD-10-CM | POA: Diagnosis not present

## 2022-02-02 DIAGNOSIS — E785 Hyperlipidemia, unspecified: Secondary | ICD-10-CM | POA: Diagnosis not present

## 2022-02-02 NOTE — Telephone Encounter (Signed)
Shaun, PT with [facility] would like verbal orders for 1 x week for 1 week, 2 x week for 2 weeks, and 1 x week for 2 weeks.  CB# [phone].  Please advise.  Thank you. ?

## 2022-02-03 NOTE — Telephone Encounter (Signed)
Called to approve orders 

## 2022-02-06 ENCOUNTER — Encounter (HOSPITAL_COMMUNITY): Payer: Self-pay | Admitting: Orthopaedic Surgery

## 2022-02-07 DIAGNOSIS — Z6836 Body mass index (BMI) 36.0-36.9, adult: Secondary | ICD-10-CM | POA: Diagnosis not present

## 2022-02-07 DIAGNOSIS — Z7982 Long term (current) use of aspirin: Secondary | ICD-10-CM | POA: Diagnosis not present

## 2022-02-07 DIAGNOSIS — Z8701 Personal history of pneumonia (recurrent): Secondary | ICD-10-CM | POA: Diagnosis not present

## 2022-02-07 DIAGNOSIS — Z9181 History of falling: Secondary | ICD-10-CM | POA: Diagnosis not present

## 2022-02-07 DIAGNOSIS — Z7985 Long-term (current) use of injectable non-insulin antidiabetic drugs: Secondary | ICD-10-CM | POA: Diagnosis not present

## 2022-02-07 DIAGNOSIS — Z96642 Presence of left artificial hip joint: Secondary | ICD-10-CM | POA: Diagnosis not present

## 2022-02-07 DIAGNOSIS — M1611 Unilateral primary osteoarthritis, right hip: Secondary | ICD-10-CM | POA: Diagnosis not present

## 2022-02-07 DIAGNOSIS — E785 Hyperlipidemia, unspecified: Secondary | ICD-10-CM | POA: Diagnosis not present

## 2022-02-07 DIAGNOSIS — N529 Male erectile dysfunction, unspecified: Secondary | ICD-10-CM | POA: Diagnosis not present

## 2022-02-07 DIAGNOSIS — Z471 Aftercare following joint replacement surgery: Secondary | ICD-10-CM | POA: Diagnosis not present

## 2022-02-07 DIAGNOSIS — E669 Obesity, unspecified: Secondary | ICD-10-CM | POA: Diagnosis not present

## 2022-02-08 ENCOUNTER — Encounter: Payer: Self-pay | Admitting: Orthopaedic Surgery

## 2022-02-08 NOTE — Telephone Encounter (Signed)
Please have him pick up a bottle of mag citrate from pharmacy.  Also, drink lots of water to stay hydrated.

## 2022-02-09 DIAGNOSIS — E669 Obesity, unspecified: Secondary | ICD-10-CM | POA: Diagnosis not present

## 2022-02-09 DIAGNOSIS — Z471 Aftercare following joint replacement surgery: Secondary | ICD-10-CM | POA: Diagnosis not present

## 2022-02-09 DIAGNOSIS — Z8701 Personal history of pneumonia (recurrent): Secondary | ICD-10-CM | POA: Diagnosis not present

## 2022-02-09 DIAGNOSIS — N529 Male erectile dysfunction, unspecified: Secondary | ICD-10-CM | POA: Diagnosis not present

## 2022-02-09 DIAGNOSIS — M1611 Unilateral primary osteoarthritis, right hip: Secondary | ICD-10-CM | POA: Diagnosis not present

## 2022-02-09 DIAGNOSIS — Z7985 Long-term (current) use of injectable non-insulin antidiabetic drugs: Secondary | ICD-10-CM | POA: Diagnosis not present

## 2022-02-09 DIAGNOSIS — E785 Hyperlipidemia, unspecified: Secondary | ICD-10-CM | POA: Diagnosis not present

## 2022-02-09 DIAGNOSIS — Z7982 Long term (current) use of aspirin: Secondary | ICD-10-CM | POA: Diagnosis not present

## 2022-02-09 DIAGNOSIS — Z9181 History of falling: Secondary | ICD-10-CM | POA: Diagnosis not present

## 2022-02-09 DIAGNOSIS — Z96642 Presence of left artificial hip joint: Secondary | ICD-10-CM | POA: Diagnosis not present

## 2022-02-09 DIAGNOSIS — Z6836 Body mass index (BMI) 36.0-36.9, adult: Secondary | ICD-10-CM | POA: Diagnosis not present

## 2022-02-14 ENCOUNTER — Telehealth: Payer: Self-pay

## 2022-02-14 ENCOUNTER — Encounter: Payer: Self-pay | Admitting: Orthopaedic Surgery

## 2022-02-14 ENCOUNTER — Ambulatory Visit (INDEPENDENT_AMBULATORY_CARE_PROVIDER_SITE_OTHER): Payer: BC Managed Care – PPO | Admitting: Physician Assistant

## 2022-02-14 DIAGNOSIS — M1611 Unilateral primary osteoarthritis, right hip: Secondary | ICD-10-CM | POA: Diagnosis not present

## 2022-02-14 DIAGNOSIS — Z6836 Body mass index (BMI) 36.0-36.9, adult: Secondary | ICD-10-CM | POA: Diagnosis not present

## 2022-02-14 DIAGNOSIS — Z9181 History of falling: Secondary | ICD-10-CM | POA: Diagnosis not present

## 2022-02-14 DIAGNOSIS — Z7985 Long-term (current) use of injectable non-insulin antidiabetic drugs: Secondary | ICD-10-CM | POA: Diagnosis not present

## 2022-02-14 DIAGNOSIS — N529 Male erectile dysfunction, unspecified: Secondary | ICD-10-CM | POA: Diagnosis not present

## 2022-02-14 DIAGNOSIS — Z8701 Personal history of pneumonia (recurrent): Secondary | ICD-10-CM | POA: Diagnosis not present

## 2022-02-14 DIAGNOSIS — Z7982 Long term (current) use of aspirin: Secondary | ICD-10-CM | POA: Diagnosis not present

## 2022-02-14 DIAGNOSIS — Z96642 Presence of left artificial hip joint: Secondary | ICD-10-CM | POA: Diagnosis not present

## 2022-02-14 DIAGNOSIS — Z471 Aftercare following joint replacement surgery: Secondary | ICD-10-CM | POA: Diagnosis not present

## 2022-02-14 DIAGNOSIS — E669 Obesity, unspecified: Secondary | ICD-10-CM | POA: Diagnosis not present

## 2022-02-14 DIAGNOSIS — E785 Hyperlipidemia, unspecified: Secondary | ICD-10-CM | POA: Diagnosis not present

## 2022-02-14 MED ORDER — MUPIROCIN 2 % EX OINT: 1.0000 "application " | TOPICAL_OINTMENT | Freq: Two times a day (BID) | CUTANEOUS | 0 refills | Status: DC

## 2022-02-14 MED ORDER — SULFAMETHOXAZOLE-TRIMETHOPRIM 800-160 MG PO TABS: 1.0000 | ORAL_TABLET | Freq: Two times a day (BID) | ORAL | 0 refills | Status: DC

## 2022-02-14 NOTE — Telephone Encounter (Signed)
Per Mendel Ryder. Patient needs to be out of work for 12 weeks. ?Can you fwd to [employer]. ? ? ?

## 2022-02-14 NOTE — Telephone Encounter (Signed)
No forms have been received thus far.  ?

## 2022-02-14 NOTE — Progress Notes (Signed)
? ?  Post-Op Visit Note ?  ?Patient: Jesus Allen           ?Date of Birth: 10-23-1971           ?MRN: [id number] ?Visit Date: 02/14/2022 ?PCP: Dorothyann Peng, NP ? ? ?Assessment & Plan: ? ?Chief Complaint:  ?Chief Complaint  ?Patient presents with  ? Left Hip - Pain, Routine Post Op  ? ?Visit Diagnoses:  ?1. History of total hip replacement, left   ? ? ?Plan: Patient is a pleasant 51 year old gentleman who comes in today 2 weeks status post left total hip replacement 01/30/2022.  He has been doing well.  He has been taking oxycodone as needed for pain.  He has been compliant taking the baby aspirin twice daily for DVT prophylaxis.  He has been getting home health therapy and is ambulating with a single-point cane.  He does note that as he has increased his activity has noticed slight drainage to the distal part of the incision.  No fevers or chills.  Examination of the left hip reveals a slight dehiscence to the distal part of the incision.  No active drainage.  He does have peri-incisional swelling which is hard to tell if this is an actual seroma due to his body habitus.  Calves are soft and nontender.  Today, sutures were removed and Steri-Strips applied.  Bactroban was placed over the slight area of drainage.  Prescription for Bactrim was sent in.  Dental prophylaxis reinforced.  He will follow-up with [place] in 4 weeks for repeat evaluation and x-rays of the left knee. ? ?Follow-Up Instructions: Return in about 4 weeks (around 03/14/2022).  ? ?Orders:  ?No orders of the defined types were placed in this encounter. ? ?No orders of the defined types were placed in this encounter. ? ? ?Imaging: ?No new imaging  ? ?PMFS History: ?Patient Active Problem List  ? Diagnosis Date Noted  ? Status post total replacement of left hip 01/30/2022  ? Primary osteoarthritis of left hip 01/29/2022  ? ?Past Medical History:  ?Diagnosis Date  ? ED (erectile dysfunction)   ? Hyperlipidemia   ? Obesity   ? Pneumonia   ?   ?Family History  ?Problem Relation Age of Onset  ? Ovarian cancer Mother   ? Diabetes Father   ? Blindness Father   ? Stroke Maternal Grandmother   ? Colon cancer Neg Hx   ? Colon polyps Neg Hx   ? Esophageal cancer Neg Hx   ? Stomach cancer Neg Hx   ? Rectal cancer Neg Hx   ?  ?Past Surgical History:  ?Procedure Laterality Date  ? FOOT FRACTURE SURGERY Left   ? FRACTURE SURGERY    ? 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  ? Procedure: LEFT TOTAL HIP ARTHROPLASTY ANTERIOR APPROACH;  Surgeon: Leandrew Koyanagi, MD;  Location: [facility];  Service: Orthopedics;  Laterality: Left;  3-C  ? ?Social History  ? ?Occupational History  ? Not on file  ?Tobacco Use  ? Smoking status: Never  ? Smokeless tobacco: Never  ?Vaping Use  ? Vaping Use: Never used  ?Substance and Sexual Activity  ? Alcohol use: Yes  ?  Alcohol/week: 4.0 standard drinks  ?  Types: 4 Shots of liquor per week  ?  Comment: "social"  ? Drug use: No  ? Sexual activity: Not on file  ? ? ? ?

## 2022-02-16 ENCOUNTER — Other Ambulatory Visit: Payer: Self-pay | Admitting: Physician Assistant

## 2022-02-16 DIAGNOSIS — E669 Obesity, unspecified: Secondary | ICD-10-CM | POA: Diagnosis not present

## 2022-02-16 DIAGNOSIS — M1611 Unilateral primary osteoarthritis, right hip: Secondary | ICD-10-CM | POA: Diagnosis not present

## 2022-02-16 DIAGNOSIS — Z96642 Presence of left artificial hip joint: Secondary | ICD-10-CM | POA: Diagnosis not present

## 2022-02-16 DIAGNOSIS — N529 Male erectile dysfunction, unspecified: Secondary | ICD-10-CM | POA: Diagnosis not present

## 2022-02-16 DIAGNOSIS — Z9181 History of falling: Secondary | ICD-10-CM | POA: Diagnosis not present

## 2022-02-16 DIAGNOSIS — Z7982 Long term (current) use of aspirin: Secondary | ICD-10-CM | POA: Diagnosis not present

## 2022-02-16 DIAGNOSIS — Z471 Aftercare following joint replacement surgery: Secondary | ICD-10-CM | POA: Diagnosis not present

## 2022-02-16 DIAGNOSIS — Z7985 Long-term (current) use of injectable non-insulin antidiabetic drugs: Secondary | ICD-10-CM | POA: Diagnosis not present

## 2022-02-16 DIAGNOSIS — Z8701 Personal history of pneumonia (recurrent): Secondary | ICD-10-CM | POA: Diagnosis not present

## 2022-02-16 DIAGNOSIS — Z6836 Body mass index (BMI) 36.0-36.9, adult: Secondary | ICD-10-CM | POA: Diagnosis not present

## 2022-02-16 DIAGNOSIS — E785 Hyperlipidemia, unspecified: Secondary | ICD-10-CM | POA: Diagnosis not present

## 2022-02-16 MED ORDER — OXYCODONE-ACETAMINOPHEN 5-325 MG PO TABS: 1.0000 | ORAL_TABLET | Freq: Four times a day (QID) | ORAL | 0 refills | Status: DC | PRN

## 2022-02-16 NOTE — Telephone Encounter (Signed)
sent 

## 2022-02-18 ENCOUNTER — Other Ambulatory Visit: Payer: Self-pay | Admitting: Physician Assistant

## 2022-02-21 DIAGNOSIS — Z7985 Long-term (current) use of injectable non-insulin antidiabetic drugs: Secondary | ICD-10-CM | POA: Diagnosis not present

## 2022-02-21 DIAGNOSIS — Z7982 Long term (current) use of aspirin: Secondary | ICD-10-CM | POA: Diagnosis not present

## 2022-02-21 DIAGNOSIS — E785 Hyperlipidemia, unspecified: Secondary | ICD-10-CM | POA: Diagnosis not present

## 2022-02-21 DIAGNOSIS — E669 Obesity, unspecified: Secondary | ICD-10-CM | POA: Diagnosis not present

## 2022-02-21 DIAGNOSIS — Z471 Aftercare following joint replacement surgery: Secondary | ICD-10-CM | POA: Diagnosis not present

## 2022-02-21 DIAGNOSIS — N529 Male erectile dysfunction, unspecified: Secondary | ICD-10-CM | POA: Diagnosis not present

## 2022-02-21 DIAGNOSIS — M1611 Unilateral primary osteoarthritis, right hip: Secondary | ICD-10-CM | POA: Diagnosis not present

## 2022-02-21 DIAGNOSIS — Z96642 Presence of left artificial hip joint: Secondary | ICD-10-CM | POA: Diagnosis not present

## 2022-02-21 DIAGNOSIS — Z6836 Body mass index (BMI) 36.0-36.9, adult: Secondary | ICD-10-CM | POA: Diagnosis not present

## 2022-02-21 DIAGNOSIS — Z8701 Personal history of pneumonia (recurrent): Secondary | ICD-10-CM | POA: Diagnosis not present

## 2022-02-21 DIAGNOSIS — Z9181 History of falling: Secondary | ICD-10-CM | POA: Diagnosis not present

## 2022-02-23 ENCOUNTER — Telehealth: Payer: Self-pay | Admitting: Orthopaedic Surgery

## 2022-02-23 NOTE — Telephone Encounter (Signed)
Received medical records release form, $25.00 cash and medical request form from patient/forwarding to [place] today ?

## 2022-02-28 ENCOUNTER — Encounter: Payer: Self-pay | Admitting: Adult Health

## 2022-02-28 ENCOUNTER — Ambulatory Visit (INDEPENDENT_AMBULATORY_CARE_PROVIDER_SITE_OTHER): Payer: BC Managed Care – PPO | Admitting: Adult Health

## 2022-02-28 VITALS — BP 120/68 | HR 107 | Temp 98.0°F | Ht 70.0 in | Wt 238.0 lb

## 2022-02-28 DIAGNOSIS — Z6836 Body mass index (BMI) 36.0-36.9, adult: Secondary | ICD-10-CM | POA: Diagnosis not present

## 2022-02-28 DIAGNOSIS — Z471 Aftercare following joint replacement surgery: Secondary | ICD-10-CM | POA: Diagnosis not present

## 2022-02-28 DIAGNOSIS — Z7982 Long term (current) use of aspirin: Secondary | ICD-10-CM | POA: Diagnosis not present

## 2022-02-28 DIAGNOSIS — E668 Other obesity: Secondary | ICD-10-CM

## 2022-02-28 DIAGNOSIS — Z7985 Long-term (current) use of injectable non-insulin antidiabetic drugs: Secondary | ICD-10-CM | POA: Diagnosis not present

## 2022-02-28 DIAGNOSIS — E7439 Other disorders of intestinal carbohydrate absorption: Secondary | ICD-10-CM | POA: Diagnosis not present

## 2022-02-28 DIAGNOSIS — Z9181 History of falling: Secondary | ICD-10-CM | POA: Diagnosis not present

## 2022-02-28 DIAGNOSIS — E785 Hyperlipidemia, unspecified: Secondary | ICD-10-CM | POA: Diagnosis not present

## 2022-02-28 DIAGNOSIS — M1611 Unilateral primary osteoarthritis, right hip: Secondary | ICD-10-CM | POA: Diagnosis not present

## 2022-02-28 DIAGNOSIS — Z96642 Presence of left artificial hip joint: Secondary | ICD-10-CM | POA: Diagnosis not present

## 2022-02-28 DIAGNOSIS — E669 Obesity, unspecified: Secondary | ICD-10-CM | POA: Diagnosis not present

## 2022-02-28 DIAGNOSIS — N529 Male erectile dysfunction, unspecified: Secondary | ICD-10-CM | POA: Diagnosis not present

## 2022-02-28 DIAGNOSIS — Z8701 Personal history of pneumonia (recurrent): Secondary | ICD-10-CM | POA: Diagnosis not present

## 2022-02-28 MED ORDER — TIRZEPATIDE 12.5 MG/0.5ML ~~LOC~~ SOAJ: 12.5000 mg | SUBCUTANEOUS | 1 refills | Status: DC

## 2022-02-28 NOTE — Progress Notes (Signed)
? ?Established Patient Office Visit ? ?Subjective   ?Patient ID: Jesus Allen, male    DOB: 10-17-1971  Age: 51 y.o. MRN: [id number] ? ?Chief Complaint  ?Patient presents with  ? Follow-up  ? ? ?HPI ? ?He is being evaluated today for 18-monthfollow-up regarding glucose intolerance/weight loss management.  He is currently managed with Mounjaro 12.5 mg. .  He has been doing well with medication with no side effects.  Reports that his appetite has been currently decreased from starting this medication.   ? ?He did recently have a total left hip replacement on 01/30/2022.  He does report doing well after the surgery. ? ?He continues to exercise and eat healthy  ? ?Wt Readings from Last 10 Encounters:  ?02/28/22 238 lb (108 kg)  ?01/30/22 250 lb (113.4 kg)  ?01/27/22 256 lb (116.1 kg)  ?12/09/21 248 lb (112.5 kg)  ?10/04/21 250 lb (113.4 kg)  ?09/06/21 255 lb (115.7 kg)  ?08/09/21 259 lb (117.5 kg)  ?07/25/21 252 lb (114.3 kg)  ?07/11/21 252 lb (114.3 kg)  ?04/12/21 265 lb (120.2 kg)  ? ?ROS ?See HPI  ? ?Past Medical History:  ?Diagnosis Date  ? ED (erectile dysfunction)   ? Hyperlipidemia   ? Obesity   ? Pneumonia   ? ? ?Social History  ? ?Socioeconomic History  ? Marital status: Married  ?  Spouse name: Not on file  ? Number of children: Not on file  ? Years of education: Not on file  ? Highest education level: Master's degree (e.g., MA, MS, MEng, MEd, MSW, MBA)  ?Occupational History  ? Not on file  ?Tobacco Use  ? Smoking status: Never  ? Smokeless tobacco: Never  ?Vaping Use  ? Vaping Use: Never used  ?Substance and Sexual Activity  ? Alcohol use: Yes  ?  Alcohol/week: 4.0 standard drinks  ?  Types: 4 Shots of liquor per week  ?  Comment: "social"  ? Drug use: No  ? Sexual activity: Not on file  ?Other Topics Concern  ? Not on file  ?Social History Narrative  ? Works as pEngineer, building services  ? Has his MBA  ? Married   ? Two children ( 13 and 17)   ? ?Social Determinants of Health  ? ?Financial Resource Strain:  Low Risk   ? Difficulty of Paying Living Expenses: Not very hard  ?Food Insecurity: Unknown  ? Worried About RCharity fundraiserin the Last Year: Never true  ? Ran Out of Food in the Last Year: Not on file  ?Transportation Needs: No Transportation Needs  ? Lack of Transportation (Medical): No  ? Lack of Transportation (Non-Medical): No  ?Physical Activity: Insufficiently Active  ? Days of Exercise per Week: 2 days  ? Minutes of Exercise per Session: 20 min  ?Stress: No Stress Concern Present  ? Feeling of Stress : Not at all  ?Social Connections: Moderately Integrated  ? Frequency of Communication with Friends and Family: Three times a week  ? Frequency of Social Gatherings with Friends and Family: Three times a week  ? Attends Religious Services: More than 4 times per year  ? Active Member of Clubs or Organizations: No  ? Attends CArchivistMeetings: Not on file  ? Marital Status: Married  ?Intimate Partner Violence: Not on file  ? ? ?Past Surgical History:  ?Procedure Laterality Date  ? FOOT FRACTURE SURGERY Left   ? FRACTURE SURGERY    ? TOTAL HIP ARTHROPLASTY Left  01/30/2022  ? Procedure: LEFT TOTAL HIP ARTHROPLASTY ANTERIOR APPROACH;  Surgeon: Leandrew Koyanagi, MD;  Location: [facility];  Service: Orthopedics;  Laterality: Left;  3-C  ? ? ?Family History  ?Problem Relation Age of Onset  ? Ovarian cancer Mother   ? Diabetes Father   ? Blindness Father   ? Stroke Maternal Grandmother   ? Colon cancer Neg Hx   ? Colon polyps Neg Hx   ? Esophageal cancer Neg Hx   ? Stomach cancer Neg Hx   ? Rectal cancer Neg Hx   ? ? ?No Known Allergies ? ?Current Outpatient Medications on File Prior to Visit  ?Medication Sig Dispense Refill  ? aspirin EC 81 MG tablet Take 1 tablet (81 mg total) by mouth 2 (two) times daily. To be taken after surgery to prevent blood clots 84 tablet 0  ? docusate sodium (COLACE) 100 MG capsule Take 1 capsule (100 mg total) by mouth daily as needed. 30 capsule 2  ? ketoconazole (NIZORAL) 2 % cream  Apply 1 application. topically daily.    ? ketoconazole (NIZORAL) 2 % shampoo Apply 1 application. topically 2 (two) times a week.    ? methocarbamol (ROBAXIN) 500 MG tablet Take 1 tablet (500 mg total) by mouth 2 (two) times daily as needed. 20 tablet 2  ? Multiple Vitamins-Minerals (MULTIVITAMIN WITH MINERALS) tablet Take 1 tablet by mouth daily. One a day men    ? mupirocin ointment (BACTROBAN) 2 % Apply 1 application. topically 2 (two) times daily. Apply twice daily until incision has fully healed 22 g 0  ? ondansetron (ZOFRAN) 4 MG tablet Take 1 tablet (4 mg total) by mouth every 8 (eight) hours as needed for nausea or vomiting. 40 tablet 0  ? oxyCODONE-acetaminophen (PERCOCET) 5-325 MG tablet Take 1-2 tablets by mouth every 6 (six) hours as needed. To be taken after surgery 40 tablet 0  ? rosuvastatin (CRESTOR) 5 MG tablet Take 1 tablet (5 mg total) by mouth daily. 90 tablet 3  ? sildenafil (VIAGRA) 100 MG tablet Take 100 mg by mouth daily as needed for erectile dysfunction.    ? sulfamethoxazole-trimethoprim (BACTRIM DS) 800-160 MG tablet Take 1 tablet by mouth 2 (two) times daily. 20 tablet 0  ? testosterone cypionate (DEPOTESTOSTERONE CYPIONATE) 200 MG/ML injection Inject 200 mg into the skin every 14 (fourteen) days.    ? tirzepatide (MOUNJARO) 12.5 MG/0.5ML Pen Inject 12.5 mg into the skin once a week. 6 mL 0  ? ?No current facility-administered medications on file prior to visit.  ? ? ?BP 120/68   Pulse (!) 107   Temp 98 ?F (36.7 ?C) (Oral)   Ht '5\' 10"'$  (1.778 m)   Wt 238 lb (108 kg)   SpO2 100%   BMI 34.15 kg/m?  ? ? ?  ?Objective:  ?  ? ?BP 120/68   Pulse (!) 107   Temp 98 ?F (36.7 ?C) (Oral)   Ht '5\' 10"'$  (1.778 m)   Wt 238 lb (108 kg)   SpO2 100%   BMI 34.15 kg/m?  ? ? ?Physical Exam ?Vitals and nursing note reviewed.  ?Constitutional:   ?   Appearance: Normal appearance.  ?Cardiovascular:  ?   Rate and Rhythm: Normal rate and regular rhythm.  ?   Pulses: Normal pulses.  ?   Heart sounds:  Normal heart sounds.  ?Pulmonary:  ?   Effort: Pulmonary effort is normal.  ?   Breath sounds: Normal breath sounds.  ?Skin: ?  General: Skin is warm and dry.  ?   Capillary Refill: Capillary refill takes less than 2 seconds.  ?Neurological:  ?   General: No focal deficit present.  ?   Mental Status: He is oriented to person, place, and time.  ?Psychiatric:     ?   Mood and Affect: Mood normal.     ?   Behavior: Behavior normal.     ?   Thought Content: Thought content normal.     ?   Judgment: Judgment normal.  ? ? ? ?No results found for any visits on 02/28/22. ? ? ? ?The 10-year ASCVD risk score (Arnett DK, et al., 2019) is: 4.5% ? ?  ?Assessment & Plan:  ? ?1. Glucose intolerance ? ?- tirzepatide (MOUNJARO) 12.5 MG/0.5ML Pen; Inject 12.5 mg into the skin once a week.  Dispense: 6 mL; Refill: 1 ? ?2. Other obesity ? ?- tirzepatide (MOUNJARO) 12.5 MG/0.5ML Pen; Inject 12.5 mg into the skin once a week.  Dispense: 6 mL; Refill: 1 ? ? ?Dorothyann Peng, NP ? ?

## 2022-03-14 ENCOUNTER — Ambulatory Visit (INDEPENDENT_AMBULATORY_CARE_PROVIDER_SITE_OTHER): Payer: BC Managed Care – PPO | Admitting: Orthopaedic Surgery

## 2022-03-14 ENCOUNTER — Encounter: Payer: Self-pay | Admitting: Orthopaedic Surgery

## 2022-03-14 ENCOUNTER — Ambulatory Visit (INDEPENDENT_AMBULATORY_CARE_PROVIDER_SITE_OTHER): Payer: BC Managed Care – PPO

## 2022-03-14 DIAGNOSIS — Z96642 Presence of left artificial hip joint: Secondary | ICD-10-CM | POA: Diagnosis not present

## 2022-03-14 NOTE — Progress Notes (Signed)
? ?  Post-Op Visit Note ?  ?Patient: Jesus Allen           ?Date of Birth: 1971-05-29           ?MRN: [id number] ?Visit Date: 03/14/2022 ?PCP: Dorothyann Peng, NP ? ? ?Assessment & Plan: ? ?Chief Complaint:  ?Chief Complaint  ?Patient presents with  ? Left Hip - Pain  ? ?Visit Diagnoses:  ?1. Status post total replacement of left hip   ?2. History of total hip replacement, left   ? ? ?Plan: 6 week THA follow up plan ? ?Patient presents for follow up 6 weeks status post total hip replacement. The wound is healed and there is no evidence of infection. TED hose may be discontinued. Radiographs reveal a total hip arthroplasty in good position, with no evidence of subsidence, loosening, or complicating features. It was reinforced that with any procedure including dental work, colonoscopy, or any invasive procedure that pre-procedural prophylactic antibiotics must be taken to decrease the risk of infection. Reminders were given about signs to be aware of including redness, drainage, increased pain, fevers, calf pain, shortness of breath, or any concern should generate a phone call or a return to see [place] immediately. Will plan to follow up at 3 months postoperatively for next evaluation with radiographs at that time.   ? ?Follow-Up Instructions: Return in about 6 weeks (around 04/25/2022).  ? ?Orders:  ?Orders Placed This Encounter  ?Procedures  ? XR HIP UNILAT W OR W/O PELVIS 2-3 VIEWS LEFT  ? ?No orders of the defined types were placed in this encounter. ? ? ?Imaging: ?XR HIP UNILAT W OR W/O PELVIS 2-3 VIEWS LEFT ? ?Result Date: 03/14/2022 ?Stable total hip replacement without complication  ? ?PMFS History: ?Patient Active Problem List  ? Diagnosis Date Noted  ? Status post total replacement of left hip 01/30/2022  ? Primary osteoarthritis of left hip 01/29/2022  ? ?Past Medical History:  ?Diagnosis Date  ? ED (erectile dysfunction)   ? Hyperlipidemia   ? Obesity   ? Pneumonia   ?  ?Family History  ?Problem  Relation Age of Onset  ? Ovarian cancer Mother   ? Diabetes Father   ? Blindness Father   ? Stroke Maternal Grandmother   ? Colon cancer Neg Hx   ? Colon polyps Neg Hx   ? Esophageal cancer Neg Hx   ? Stomach cancer Neg Hx   ? Rectal cancer Neg Hx   ?  ?Past Surgical History:  ?Procedure Laterality Date  ? FOOT FRACTURE SURGERY Left   ? FRACTURE SURGERY    ? 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  ? Procedure: LEFT TOTAL HIP ARTHROPLASTY ANTERIOR APPROACH;  Surgeon: Leandrew Koyanagi, MD;  Location: [facility];  Service: Orthopedics;  Laterality: Left;  3-C  ? ?Social History  ? ?Occupational History  ? Not on file  ?Tobacco Use  ? Smoking status: Never  ? Smokeless tobacco: Never  ?Vaping Use  ? Vaping Use: Never used  ?Substance and Sexual Activity  ? Alcohol use: Yes  ?  Alcohol/week: 4.0 standard drinks  ?  Types: 4 Shots of liquor per week  ?  Comment: "social"  ? Drug use: No  ? Sexual activity: Not on file  ? ? ? ?

## 2022-03-17 ENCOUNTER — Encounter: Payer: Self-pay | Admitting: Orthopaedic Surgery

## 2022-03-20 ENCOUNTER — Other Ambulatory Visit: Payer: Self-pay | Admitting: Physician Assistant

## 2022-03-20 MED ORDER — AMOXICILLIN 500 MG PO CAPS: ORAL_CAPSULE | ORAL | 0 refills | Status: DC

## 2022-03-20 NOTE — Telephone Encounter (Signed)
That would be great!  I sent in this one

## 2022-03-22 DIAGNOSIS — N401 Enlarged prostate with lower urinary tract symptoms: Secondary | ICD-10-CM | POA: Diagnosis not present

## 2022-03-22 DIAGNOSIS — E349 Endocrine disorder, unspecified: Secondary | ICD-10-CM | POA: Diagnosis not present

## 2022-03-22 DIAGNOSIS — R350 Frequency of micturition: Secondary | ICD-10-CM | POA: Diagnosis not present

## 2022-03-23 ENCOUNTER — Other Ambulatory Visit: Payer: Self-pay | Admitting: Physician Assistant

## 2022-03-23 ENCOUNTER — Telehealth: Payer: Self-pay | Admitting: Orthopaedic Surgery

## 2022-03-23 MED ORDER — AMOXICILLIN 500 MG PO CAPS: ORAL_CAPSULE | ORAL | 0 refills | Status: DC

## 2022-03-23 NOTE — Telephone Encounter (Signed)
[facility] called for medical clearance for pt.  ? ?Email : [email or web address] ?

## 2022-03-23 NOTE — Telephone Encounter (Signed)
I sent in Monday, but I just resent just in case didn't go through?

## 2022-03-24 NOTE — Telephone Encounter (Signed)
EMAILED

## 2022-03-31 ENCOUNTER — Encounter: Payer: Self-pay | Admitting: Orthopaedic Surgery

## 2022-04-03 ENCOUNTER — Other Ambulatory Visit: Payer: Self-pay | Admitting: Adult Health

## 2022-04-04 NOTE — Telephone Encounter (Signed)
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

## 2022-04-05 ENCOUNTER — Telehealth: Payer: Self-pay | Admitting: *Deleted

## 2022-04-05 NOTE — Telephone Encounter (Signed)
Prior auth for  tirzepatide [facility]) 12.5 MG/0.5ML Pen  Was denied.  Information given to [facility].

## 2022-04-13 ENCOUNTER — Other Ambulatory Visit: Payer: Self-pay | Admitting: Adult Health

## 2022-04-13 ENCOUNTER — Encounter: Payer: Self-pay | Admitting: Adult Health

## 2022-04-13 ENCOUNTER — Other Ambulatory Visit (HOSPITAL_BASED_OUTPATIENT_CLINIC_OR_DEPARTMENT_OTHER): Payer: Self-pay

## 2022-04-13 MED ORDER — TIRZEPATIDE 15 MG/0.5ML ~~LOC~~ SOAJ
15.0000 mg | SUBCUTANEOUS | 0 refills | Status: DC
  Filled 2022-04-13 – 2022-04-18 (×2): qty 2, 28d supply, fill #0
  Filled 2022-05-12 – 2022-05-26 (×2): qty 2, 28d supply, fill #1

## 2022-04-13 NOTE — Telephone Encounter (Signed)
Please advise 

## 2022-04-14 ENCOUNTER — Ambulatory Visit (INDEPENDENT_AMBULATORY_CARE_PROVIDER_SITE_OTHER): Payer: BC Managed Care – PPO | Admitting: Adult Health

## 2022-04-14 ENCOUNTER — Encounter: Payer: Self-pay | Admitting: Adult Health

## 2022-04-14 VITALS — BP 120/86 | HR 87 | Temp 98.4°F | Ht 70.0 in | Wt 232.0 lb

## 2022-04-14 DIAGNOSIS — Z0000 Encounter for general adult medical examination without abnormal findings: Secondary | ICD-10-CM | POA: Diagnosis not present

## 2022-04-14 DIAGNOSIS — E668 Other obesity: Secondary | ICD-10-CM | POA: Diagnosis not present

## 2022-04-14 DIAGNOSIS — Z23 Encounter for immunization: Secondary | ICD-10-CM

## 2022-04-14 DIAGNOSIS — E7439 Other disorders of intestinal carbohydrate absorption: Secondary | ICD-10-CM

## 2022-04-14 DIAGNOSIS — Z125 Encounter for screening for malignant neoplasm of prostate: Secondary | ICD-10-CM | POA: Diagnosis not present

## 2022-04-14 DIAGNOSIS — E6689 Other obesity not elsewhere classified: Secondary | ICD-10-CM

## 2022-04-14 LAB — COMPREHENSIVE METABOLIC PANEL
ALT: 24 U/L (ref 0–53)
AST: 26 U/L (ref 0–37)
Albumin: 4.3 g/dL (ref 3.5–5.2)
Alkaline Phosphatase: 139 U/L — ABNORMAL HIGH (ref 39–117)
BUN: 7 mg/dL (ref 6–23)
CO2: 26 mEq/L (ref 19–32)
Calcium: 9.2 mg/dL (ref 8.4–10.5)
Chloride: 107 mEq/L (ref 96–112)
Creatinine, Ser: 0.78 mg/dL (ref 0.40–1.50)
GFR: 103.19 mL/min (ref >60.00)
Glucose, Bld: 77 mg/dL (ref 70–99)
Potassium: 4.3 mEq/L (ref 3.5–5.1)
Sodium: 142 mEq/L (ref 135–145)
Total Bilirubin: 0.7 mg/dL (ref 0.2–1.2)
Total Protein: 7.2 g/dL (ref 6.0–8.3)

## 2022-04-14 LAB — PSA: PSA: 0.55 ng/mL (ref 0.10–4.00)

## 2022-04-14 LAB — LIPID PANEL
Cholesterol: 187 mg/dL (ref 0–200)
HDL: 65.1 mg/dL (ref >39.00)
LDL Cholesterol: 104 mg/dL — ABNORMAL HIGH (ref 0–99)
NonHDL: 121.84
Total CHOL/HDL Ratio: 3
Triglycerides: 87 mg/dL (ref 0.0–149.0)
VLDL: 17.4 mg/dL (ref 0.0–40.0)

## 2022-04-14 LAB — CBC WITH DIFFERENTIAL/PLATELET
Basophils Absolute: 0 10*3/uL (ref 0.0–0.1)
Basophils Relative: 0.9 % (ref 0.0–3.0)
Eosinophils Absolute: 0.1 10*3/uL (ref 0.0–0.7)
Eosinophils Relative: 2.8 % (ref 0.0–5.0)
HCT: 42.7 % (ref 39.0–52.0)
Hemoglobin: 14 g/dL (ref 13.0–17.0)
Lymphocytes Relative: 42.7 % (ref 12.0–46.0)
Lymphs Abs: 1.1 10*3/uL (ref 0.7–4.0)
MCHC: 32.8 g/dL (ref 30.0–36.0)
MCV: 92.2 fl (ref 78.0–100.0)
Monocytes Absolute: 0.2 10*3/uL (ref 0.1–1.0)
Monocytes Relative: 9.6 % (ref 3.0–12.0)
Neutro Abs: 1.1 10*3/uL — ABNORMAL LOW (ref 1.4–7.7)
Neutrophils Relative %: 44 % (ref 43.0–77.0)
Platelets: 300 10*3/uL (ref 150.0–400.0)
RBC: 4.63 Mil/uL (ref 4.22–5.81)
RDW: 14.6 % (ref 11.5–15.5)
WBC: 2.5 10*3/uL — ABNORMAL LOW (ref 4.0–10.5)

## 2022-04-14 LAB — HEMOGLOBIN A1C: Hgb A1c MFr Bld: 4.7 % (ref 4.6–6.5)

## 2022-04-14 LAB — TSH: TSH: 1.3 u[IU]/mL (ref 0.35–5.50)

## 2022-04-14 NOTE — Progress Notes (Signed)
Subjective:    Patient ID: Jesus Allen, male    DOB: 1971-10-15, 51 y.o.   MRN: [id number]  HPI Patient presents for yearly preventative medicine examination. He is a pleasant 51 year old male who  has a past medical history of ED (erectile dysfunction), Hyperlipidemia, Obesity, and Pneumonia.  Glucose Intolerance -managed currently with Mounjaro 15 mg a week.  He has been doing well with this medication with no side effects.  He does report that the medication decreases his appetite Lab Results  Component Value Date   HGBA1C 5.9 04/12/2021   Wt Readings from Last 10 Encounters:  04/14/22 232 lb (105.2 kg)  02/28/22 238 lb (108 kg)  01/30/22 250 lb (113.4 kg)  01/27/22 256 lb (116.1 kg)  12/09/21 248 lb (112.5 kg)  10/04/21 250 lb (113.4 kg)  09/06/21 255 lb (115.7 kg)  08/09/21 259 lb (117.5 kg)  07/25/21 252 lb (114.3 kg)  07/11/21 252 lb (114.3 kg)   Hyperlipidemia-takes Crestor 5 mg daily.  He denies myalgia or fatigue Lab Results  Component Value Date   CHOL 229 (H) 04/12/2021   HDL 80.50 04/12/2021   LDLCALC 134 (H) 04/12/2021   TRIG 74.0 04/12/2021   CHOLHDL 3 04/12/2021     Erectile dysfunction-takes Viagra as needed  Hypogonadism-is managed by urology with testosterone injections.  All immunizations and health maintenance protocols were reviewed with the patient and needed orders were placed.  Appropriate screening laboratory values were ordered for the patient including screening of hyperlipidemia, renal function and hepatic function. If indicated by BPH, a PSA was ordered.  Medication reconciliation,  past medical history, social history, problem list and allergies were reviewed in detail with the patient  Goals were established with regard to weight loss, exercise, and  diet in compliance with medications. He is eating healthy and walking 2 miles per day   Review of Systems  Constitutional: Negative.   HENT: Negative.    Eyes: Negative.    Respiratory: Negative.    Cardiovascular: Negative.   Gastrointestinal: Negative.   Endocrine: Negative.   Genitourinary: Negative.   Musculoskeletal: Negative.   Skin: Negative.   Allergic/Immunologic: Negative.   Neurological: Negative.   Hematological: Negative.   Psychiatric/Behavioral: Negative.    All other systems reviewed and are negative.  Past Medical History:  Diagnosis Date   ED (erectile dysfunction)    Hyperlipidemia    Obesity    Pneumonia     Social History   Socioeconomic History   Marital status: Married    Spouse name: Not on file   Number of children: Not on file   Years of education: Not on file   Highest education level: Master's degree (e.g., MA, MS, MEng, MEd, MSW, MBA)  Occupational History   Not on file  Tobacco Use   Smoking status: Never   Smokeless tobacco: Never  Vaping Use   Vaping Use: Never used  Substance and Sexual Activity   Alcohol use: Yes    Alcohol/week: 4.0 standard drinks    Types: 4 Shots of liquor per week    Comment: "social"   Drug use: No   Sexual activity: Not on file  Other Topics Concern   Not on file  Social History Narrative   Works as Engineer, building services    Has his MBA   Married    Two children ( 87 and 17)    Social Determinants of Health   Financial Resource Strain: Low Risk    Difficulty  of Paying Living Expenses: Not very hard  Food Insecurity: Unknown   Worried About Running Out of Food in the Last Year: Never true   Ran Out of Food in the Last Year: Not on file  Transportation Needs: No Transportation Needs   Lack of Transportation (Medical): No   Lack of Transportation (Non-Medical): No  Physical Activity: Insufficiently Active   Days of Exercise per Week: 2 days   Minutes of Exercise per Session: 20 min  Stress: No Stress Concern Present   Feeling of Stress : Not at all  Social Connections: Moderately Integrated   Frequency of Communication with Friends and Family: Three times a week    Frequency of Social Gatherings with Friends and Family: Three times a week   Attends Religious Services: More than 4 times per year   Active Member of Clubs or Organizations: No   Attends Music therapist: Not on file   Marital Status: Married  Human resources officer Violence: Not on file    Past Surgical History:  Procedure Laterality Date   FOOT FRACTURE SURGERY Left    FRACTURE SURGERY     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   Procedure: LEFT TOTAL HIP ARTHROPLASTY ANTERIOR APPROACH;  Surgeon: Leandrew Koyanagi, MD;  Location: [place];  Service: Orthopedics;  Laterality: Left;  3-C    Family History  Problem Relation Age of Onset   Ovarian cancer Mother    Diabetes Father    Blindness Father    Stroke Maternal Grandmother    Colon cancer Neg Hx    Colon polyps Neg Hx    Esophageal cancer Neg Hx    Stomach cancer Neg Hx    Rectal cancer Neg Hx     No Known Allergies  Current Outpatient Medications on File Prior to Visit  Medication Sig Dispense Refill   amoxicillin (AMOXIL) 500 MG capsule Take 4 pills one hour prior to dental work 8 capsule 0   aspirin EC 81 MG tablet Take 1 tablet (81 mg total) by mouth 2 (two) times daily. To be taken after surgery to prevent blood clots 84 tablet 0   docusate sodium (COLACE) 100 MG capsule Take 1 capsule (100 mg total) by mouth daily as needed. 30 capsule 2   ketoconazole (NIZORAL) 2 % cream Apply 1 application. topically daily.     ketoconazole (NIZORAL) 2 % shampoo Apply 1 application. topically 2 (two) times a week.     methocarbamol (ROBAXIN) 500 MG tablet Take 1 tablet (500 mg total) by mouth 2 (two) times daily as needed. 20 tablet 2   Multiple Vitamins-Minerals (MULTIVITAMIN WITH MINERALS) tablet Take 1 tablet by mouth daily. One a day men     mupirocin ointment (BACTROBAN) 2 % Apply 1 application. topically 2 (two) times daily. Apply twice daily until incision has fully healed 22 g 0   ondansetron (ZOFRAN) 4 MG tablet Take 1  tablet (4 mg total) by mouth every 8 (eight) hours as needed for nausea or vomiting. 40 tablet 0   oxyCODONE-acetaminophen (PERCOCET) 5-325 MG tablet Take 1-2 tablets by mouth every 6 (six) hours as needed. To be taken after surgery 40 tablet 0   rosuvastatin (CRESTOR) 5 MG tablet Take 1 tablet (5 mg total) by mouth daily. KEEP SCHEDULED APPT WITH Naje Rice. 30 tablet 0   sildenafil (VIAGRA) 100 MG tablet Take 100 mg by mouth daily as needed for erectile dysfunction.     sulfamethoxazole-trimethoprim (BACTRIM DS) 800-160 MG tablet Take 1 tablet  by mouth 2 (two) times daily. 20 tablet 0   tamsulosin (FLOMAX) 0.4 MG CAPS capsule Take 0.4 mg by mouth at bedtime.     testosterone cypionate (DEPOTESTOSTERONE CYPIONATE) 200 MG/ML injection Inject 200 mg into the skin every 14 (fourteen) days.     tirzepatide (MOUNJARO) 15 MG/0.5ML Pen Inject 15 mg into the skin once a week. 6 mL 0   No current facility-administered medications on file prior to visit.    BP 120/86   Pulse 87   Temp 98.4 F (36.9 C) (Oral)   Ht '5\' 10"'$  (1.778 m)   Wt 232 lb (105.2 kg)   SpO2 97%   BMI 33.29 kg/m       Objective:   Physical Exam Vitals and nursing note reviewed.  Constitutional:      General: He is not in acute distress.    Appearance: Normal appearance. He is well-developed. He is obese.  HENT:     Head: Normocephalic and atraumatic.     Right Ear: Tympanic membrane, ear canal and external ear normal. There is no impacted cerumen.     Left Ear: Tympanic membrane, ear canal and external ear normal. There is no impacted cerumen.     Nose: Nose normal. No congestion or rhinorrhea.     Mouth/Throat:     Mouth: Mucous membranes are moist.     Pharynx: Oropharynx is clear. No oropharyngeal exudate or posterior oropharyngeal erythema.  Eyes:     General:        Right eye: No discharge.        Left eye: No discharge.     Extraocular Movements: Extraocular movements intact.     Conjunctiva/sclera: Conjunctivae  normal.     Pupils: Pupils are equal, round, and reactive to light.  Neck:     Vascular: No carotid bruit.     Trachea: No tracheal deviation.  Cardiovascular:     Rate and Rhythm: Normal rate and regular rhythm.     Pulses: Normal pulses.     Heart sounds: Normal heart sounds. No murmur heard.   No friction rub. No gallop.  Pulmonary:     Effort: Pulmonary effort is normal. No respiratory distress.     Breath sounds: Normal breath sounds. No stridor. No wheezing, rhonchi or rales.  Chest:     Chest wall: No tenderness.  Abdominal:     General: Bowel sounds are normal. There is no distension.     Palpations: Abdomen is soft. There is no mass.     Tenderness: There is no abdominal tenderness. There is no right CVA tenderness, left CVA tenderness, guarding or rebound.     Hernia: No hernia is present.  Musculoskeletal:        General: No swelling, tenderness, deformity or signs of injury. Normal range of motion.     Right lower leg: No edema.     Left lower leg: No edema.  Lymphadenopathy:     Cervical: No cervical adenopathy.  Skin:    General: Skin is warm and dry.     Capillary Refill: Capillary refill takes less than 2 seconds.     Coloration: Skin is not jaundiced or pale.     Findings: No bruising, erythema, lesion or rash.  Neurological:     General: No focal deficit present.     Mental Status: He is alert and oriented to person, place, and time.     Cranial Nerves: No cranial nerve deficit.     Sensory:  No sensory deficit.     Motor: No weakness.     Coordination: Coordination normal.     Gait: Gait normal.     Deep Tendon Reflexes: Reflexes normal.  Psychiatric:        Mood and Affect: Mood normal.        Behavior: Behavior normal.        Thought Content: Thought content normal.        Judgment: Judgment normal.      Assessment & Plan:  1. Routine general medical examination at a health care facility - Continue to exercise and eat healthy  - Follow up in one  year or sooner if needed - CBC with Differential/Platelet; Future - Comprehensive metabolic panel; Future - Hemoglobin A1c; Future - Lipid panel; Future - TSH; Future  2. Glucose intolerance - Continue with Mounjaro 15 mg weekly.  - CBC with Differential/Platelet; Future - Comprehensive metabolic panel; Future - Hemoglobin A1c; Future - Lipid panel; Future - TSH; Future  3. Other obesity - Continue with weight loss strategies  - CBC with Differential/Platelet; Future - Comprehensive metabolic panel; Future - Hemoglobin A1c; Future - Lipid panel; Future - TSH; Future  4. Prostate cancer screening  - PSA; Future  5. Need for shingles vaccine

## 2022-04-14 NOTE — Addendum Note (Signed)
Addended by: Gwenyth Ober R on: 04/14/2022 02:41 PM   Modules accepted: Orders

## 2022-04-14 NOTE — Patient Instructions (Signed)
It was great seeing you today   We will follow up with you regarding your lab work   Please let me know if you need anything   Follow up in 2-6 months for your second shingles shot

## 2022-04-18 ENCOUNTER — Other Ambulatory Visit (HOSPITAL_BASED_OUTPATIENT_CLINIC_OR_DEPARTMENT_OTHER): Payer: Self-pay

## 2022-05-02 ENCOUNTER — Encounter: Payer: Self-pay | Admitting: Orthopaedic Surgery

## 2022-05-02 ENCOUNTER — Ambulatory Visit (INDEPENDENT_AMBULATORY_CARE_PROVIDER_SITE_OTHER): Payer: BC Managed Care – PPO | Admitting: Orthopaedic Surgery

## 2022-05-02 ENCOUNTER — Ambulatory Visit (INDEPENDENT_AMBULATORY_CARE_PROVIDER_SITE_OTHER): Payer: BC Managed Care – PPO

## 2022-05-02 DIAGNOSIS — Z96642 Presence of left artificial hip joint: Secondary | ICD-10-CM

## 2022-05-02 MED ORDER — DICLOFENAC SODIUM 75 MG PO TBEC: 75.0000 mg | DELAYED_RELEASE_TABLET | Freq: Two times a day (BID) | ORAL | 2 refills | Status: DC | PRN

## 2022-05-02 MED ORDER — TRAMADOL HCL 50 MG PO TABS: 50.0000 mg | ORAL_TABLET | Freq: Two times a day (BID) | ORAL | 2 refills | Status: DC | PRN

## 2022-05-02 NOTE — Progress Notes (Signed)
Post-Op Visit Note   Patient: Jesus Allen           Date of Birth: [birth year]           MRN: [id number] Visit Date: 05/02/2022 PCP: Dorothyann Peng, NP   Assessment & Plan:  Chief Complaint:  Chief Complaint  Patient presents with   Left Hip - Routine Post Op   Visit Diagnoses:  1. History of total hip replacement, left     Plan: Patient is a pleasant 51 year old gentleman who comes in today 3 months status post left total hip replacement 01/30/2022.  He has been doing okay but still notes a fair amount of discomfort going from a seated to standing position.  He returned to work several weeks ago where he is physically walking and climbing stairs quite often.  He also notes he has been traveling once a week back and forth to [place] where he is having to walk through airports.  The discomfort he has been having is primarily to the groin.  He notes he is also been trying to walk 1 to 2 miles a day as well as work on his hip exercises.  He is not taking anything for the pain/discomfort.  Examination of his left hip reveals slight pain with resisted hip flexion.  He does have slight pain with logroll.  He is neurovascular intact distally.  At this point, would like for him to continue with light duty at work.  I would also like for him to stop walking the 1 to 2 miles a day as well as his home exercise program for the next 2 weeks.  I have sent in Voltaren as well as tramadol to take as needed.  Dental prophylaxis reinforced.  Follow-up in 3 months for repeat evaluation and AP pelvis x-rays. Follow-Up Instructions: Return in about 3 months (around 08/02/2022).   Orders:  Orders Placed This Encounter  Procedures   XR HIP UNILAT W OR W/O PELVIS 2-3 VIEWS LEFT   Meds ordered this encounter  Medications   diclofenac (VOLTAREN) 75 MG EC tablet    Sig: Take 1 tablet (75 mg total) by mouth 2 (two) times daily as needed.    Dispense:  60 tablet    Refill:  2   traMADol (ULTRAM)  50 MG tablet    Sig: Take 1 tablet (50 mg total) by mouth every 12 (twelve) hours as needed.    Dispense:  30 tablet    Refill:  2    Imaging: No new imaging  PMFS History: Patient Active Problem List   Diagnosis Date Noted   Status post total replacement of left hip 01/30/2022   Primary osteoarthritis of left hip 01/29/2022   Past Medical History:  Diagnosis Date   ED (erectile dysfunction)    Hyperlipidemia    Obesity    Pneumonia     Family History  Problem Relation Age of Onset   Ovarian cancer Mother    Diabetes Father    Blindness Father    Stroke Maternal Grandmother    Colon cancer Neg Hx    Colon polyps Neg Hx    Esophageal cancer Neg Hx    Stomach cancer Neg Hx    Rectal cancer Neg Hx     Past Surgical History:  Procedure Laterality Date   FOOT FRACTURE SURGERY Left    FRACTURE SURGERY     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   Procedure: LEFT TOTAL HIP ARTHROPLASTY ANTERIOR APPROACH;  Surgeon:  Leandrew Koyanagi, MD;  Location: [facility];  Service: Orthopedics;  Laterality: Left;  3-C   Social History   Occupational History   Not on file  Tobacco Use   Smoking status: Never   Smokeless tobacco: Never  Vaping Use   Vaping Use: Never used  Substance and Sexual Activity   Alcohol use: Yes    Alcohol/week: 4.0 standard drinks of alcohol    Types: 4 Shots of liquor per week    Comment: "social"   Drug use: No   Sexual activity: Not on file

## 2022-05-05 ENCOUNTER — Other Ambulatory Visit: Payer: Self-pay | Admitting: Adult Health

## 2022-05-19 ENCOUNTER — Other Ambulatory Visit (HOSPITAL_COMMUNITY): Payer: Self-pay

## 2022-05-19 MED ORDER — MOUNJARO 12.5 MG/0.5ML ~~LOC~~ SOAJ
SUBCUTANEOUS | 1 refills | Status: DC
  Filled 2022-05-19: qty 2, 30d supply, fill #0
  Filled 2022-05-26: qty 2, 28d supply, fill #0

## 2022-05-20 ENCOUNTER — Other Ambulatory Visit (HOSPITAL_COMMUNITY): Payer: Self-pay

## 2022-05-24 ENCOUNTER — Other Ambulatory Visit (HOSPITAL_COMMUNITY): Payer: Self-pay

## 2022-05-26 ENCOUNTER — Encounter: Payer: Self-pay | Admitting: Adult Health

## 2022-05-26 ENCOUNTER — Other Ambulatory Visit (HOSPITAL_BASED_OUTPATIENT_CLINIC_OR_DEPARTMENT_OTHER): Payer: Self-pay

## 2022-05-27 IMAGING — XA DG FLUORO GUIDE NDL PLC/BX
1 series · 1 of 1 positions shown · non-contrast
Comparison: None.

CLINICAL DATA: Primary osteoarthritis of left hip.  Left hip pain.

EXAM:
LEFT HIP INJECTION UNDER FLUOROSCOPY

[Series 1: ortho adipose · 1 of 1 slices shown]
[im 1/1]
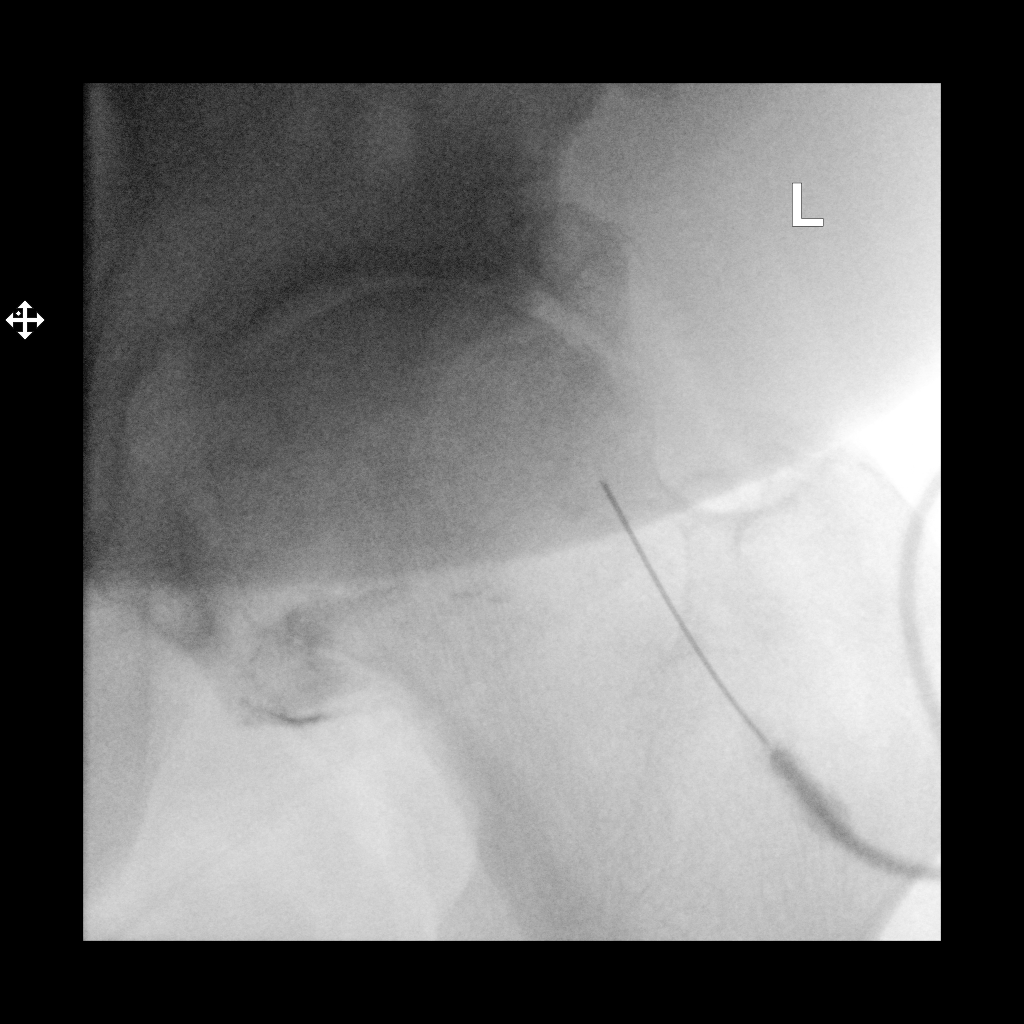

[1 of 1 positions shown; findings below may reference images not displayed]

FLUOROSCOPY TIME:  Fluoroscopy Time:  12 seconds

Radiation Exposure Index (if provided by the fluoroscopic device):
30.40 microGray*m^2

Number of Acquired Spot Images: 0

PROCEDURE:
The overlying skin was prepped with Betadine, draped in the usual
sterile fashion, and infiltrated locally with 1% lidocaine. A
inch 22 gauge spinal needle was advanced to the lateral aspect of
the left femoral head-neck junction. 1 mL of 1% lidocaine injected
easily. Diagnostic injection of a small amount of Isovue-M 200
demonstrated intra-articular spread without intravascular component.
80 mg of Depo-Medrol and 4 mL of 0.25% bupivacaine were then
administered. The needle was removed and a sterile dressing was
applied. There was no immediate complication.
IMPRESSION: Technically successful left hip injection under fluoroscopy.

## 2022-05-30 NOTE — Telephone Encounter (Signed)
Please advise 

## 2022-05-31 ENCOUNTER — Other Ambulatory Visit: Payer: Self-pay | Admitting: Adult Health

## 2022-05-31 ENCOUNTER — Other Ambulatory Visit: Payer: Self-pay | Admitting: Physician Assistant

## 2022-05-31 MED ORDER — WEGOVY 1.7 MG/0.75ML ~~LOC~~ SOAJ
1.7000 mg | SUBCUTANEOUS | 2 refills | Status: DC
Start: 2022-05-31 — End: 2023-06-07

## 2022-06-19 ENCOUNTER — Other Ambulatory Visit (HOSPITAL_BASED_OUTPATIENT_CLINIC_OR_DEPARTMENT_OTHER): Payer: Self-pay

## 2022-06-20 ENCOUNTER — Other Ambulatory Visit: Payer: Self-pay | Admitting: Adult Health

## 2022-06-20 MED ORDER — TIRZEPATIDE 15 MG/0.5ML ~~LOC~~ SOAJ: 15.0000 mg | SUBCUTANEOUS | 0 refills | Status: DC

## 2022-06-22 NOTE — Telephone Encounter (Signed)
Yes it says approved below:  Bethena Roys Key: [id number] - PA Case ID: [id number] Need help? Call us at [phone] Outcome Additional Information Required This request has been approved using information available on the patient's profile. [id number];Review Type:Prior Auth;Coverage Start Date:05/16/2022;Coverage End Date:06/15/2023; Drug Mounjaro 12.'5MG'$ /0.5ML pen-injectors Form [employer] Electronic PA Form [phone] NCPDP)

## 2022-06-23 ENCOUNTER — Other Ambulatory Visit: Payer: Self-pay | Admitting: Physician Assistant

## 2022-08-02 ENCOUNTER — Encounter: Payer: Self-pay | Admitting: Orthopaedic Surgery

## 2022-08-02 ENCOUNTER — Ambulatory Visit (INDEPENDENT_AMBULATORY_CARE_PROVIDER_SITE_OTHER): Payer: BC Managed Care – PPO

## 2022-08-02 ENCOUNTER — Ambulatory Visit (INDEPENDENT_AMBULATORY_CARE_PROVIDER_SITE_OTHER): Payer: BC Managed Care – PPO | Admitting: Orthopaedic Surgery

## 2022-08-02 DIAGNOSIS — Z96642 Presence of left artificial hip joint: Secondary | ICD-10-CM | POA: Diagnosis not present

## 2022-08-02 NOTE — Progress Notes (Signed)
   Post-Op Visit Note   Patient: Jesus Allen           Date of Birth: [birth year]           MRN: [id number] Visit Date: 08/02/2022 PCP: Dorothyann Peng, NP   Assessment & Plan:  Chief Complaint:  Chief Complaint  Patient presents with   Left Hip - Follow-up    Left total hip arthroplasty 01/30/2022   Visit Diagnoses:  1. History of total hip replacement, left     Plan: Patient is 6 months status post left total hip replacement on 01/30/2022.  Only complaint is some start of stiffness and soreness.  Otherwise doing well.  He has been back to work for a while.  No problems with going back to work.  Examination of the left hip shows a fully healed surgical scar.  He has painless range of motion of the hip.  Normal gait and ambulation.  X-rays show stable implant.  Patient has done very well from his surgery.  Dental prophylaxis reinforced.  Recheck in 6 months with standing AP pelvis x-rays.  Follow-Up Instructions: Return in about 6 months (around 01/31/2023).   Orders:  Orders Placed This Encounter  Procedures   XR Pelvis 1-2 Views   No orders of the defined types were placed in this encounter.   Imaging: XR Pelvis 1-2 Views  Result Date: 08/02/2022 There is a mild amount of heterotopic ossification formation to the abductors and iliopsoas tendon.  Hip replacement implant looks stable without any complications.   PMFS History: Patient Active Problem List   Diagnosis Date Noted   Status post total replacement of left hip 01/30/2022   Primary osteoarthritis of left hip 01/29/2022   Past Medical History:  Diagnosis Date   ED (erectile dysfunction)    Hyperlipidemia    Obesity    Pneumonia     Family History  Problem Relation Age of Onset   Ovarian cancer Mother    Diabetes Father    Blindness Father    Stroke Maternal Grandmother    Colon cancer Neg Hx    Colon polyps Neg Hx    Esophageal cancer Neg Hx    Stomach cancer Neg Hx    Rectal cancer Neg  Hx     Past Surgical History:  Procedure Laterality Date   FOOT FRACTURE SURGERY Left    FRACTURE SURGERY     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   Procedure: LEFT TOTAL HIP ARTHROPLASTY ANTERIOR APPROACH;  Surgeon: Leandrew Koyanagi, MD;  Location: [facility];  Service: Orthopedics;  Laterality: Left;  3-C   Social History   Occupational History   Not on file  Tobacco Use   Smoking status: Never   Smokeless tobacco: Never  Vaping Use   Vaping Use: Never used  Substance and Sexual Activity   Alcohol use: Yes    Alcohol/week: 4.0 standard drinks of alcohol    Types: 4 Shots of liquor per week    Comment: "social"   Drug use: No   Sexual activity: Not on file

## 2022-08-20 ENCOUNTER — Other Ambulatory Visit: Payer: Self-pay | Admitting: Physician Assistant

## 2022-08-24 ENCOUNTER — Other Ambulatory Visit: Payer: Self-pay | Admitting: Adult Health

## 2022-09-21 ENCOUNTER — Ambulatory Visit: Payer: BC Managed Care – PPO

## 2022-10-09 DIAGNOSIS — M545 Low back pain, unspecified: Secondary | ICD-10-CM | POA: Diagnosis not present

## 2022-11-17 DIAGNOSIS — E349 Endocrine disorder, unspecified: Secondary | ICD-10-CM | POA: Diagnosis not present

## 2022-12-12 DIAGNOSIS — R350 Frequency of micturition: Secondary | ICD-10-CM | POA: Diagnosis not present

## 2022-12-12 DIAGNOSIS — E349 Endocrine disorder, unspecified: Secondary | ICD-10-CM | POA: Diagnosis not present

## 2022-12-12 DIAGNOSIS — N401 Enlarged prostate with lower urinary tract symptoms: Secondary | ICD-10-CM | POA: Diagnosis not present

## 2022-12-12 DIAGNOSIS — N5201 Erectile dysfunction due to arterial insufficiency: Secondary | ICD-10-CM | POA: Diagnosis not present

## 2023-01-03 ENCOUNTER — Other Ambulatory Visit: Payer: Self-pay | Admitting: Adult Health

## 2023-01-31 ENCOUNTER — Other Ambulatory Visit (INDEPENDENT_AMBULATORY_CARE_PROVIDER_SITE_OTHER): Payer: BC Managed Care – PPO

## 2023-01-31 ENCOUNTER — Encounter: Payer: Self-pay | Admitting: Orthopaedic Surgery

## 2023-01-31 ENCOUNTER — Ambulatory Visit (INDEPENDENT_AMBULATORY_CARE_PROVIDER_SITE_OTHER): Payer: BC Managed Care – PPO | Admitting: Orthopaedic Surgery

## 2023-01-31 DIAGNOSIS — Z96642 Presence of left artificial hip joint: Secondary | ICD-10-CM | POA: Diagnosis not present

## 2023-01-31 NOTE — Progress Notes (Signed)
Office Visit Note   Patient: Jesus Allen           Date of Birth: Jan 06, 1971           MRN: [id number] Visit Date: 01/31/2023              Requested by: Dorothyann Peng, NP [place],  [place] [address] PCP: Dorothyann Peng, NP   Assessment & Plan: Visit Diagnoses:  1. History of total hip replacement, left     Plan: Jesus Allen is now 1 year status post left total hip replacement.  He has done very well and very pleased.  Dental prophylaxis reinforced.  Sounds like the heterotopic ossification does not cause any pain but some stiffness which is not functionally limiting.  Recheck in another year with standing AP pelvis x-rays.  Follow-Up Instructions: Return in about 1 year (around 01/31/2024).   Orders:  Orders Placed This Encounter  Procedures   XR Pelvis 1-2 Views   No orders of the defined types were placed in this encounter.     Procedures: No procedures performed   Clinical Data: No additional findings.   Subjective: Chief Complaint  Patient presents with   Left Hip - Follow-up    Left total hip arthroplasty 01/30/2022    HPI  Jesus Allen is 1 year status post left total hip replacement.  His only complaint that he gets stiff at times but overall he is very happy and reports no pain.  Review of Systems   Objective: Vital Signs: There were no vitals taken for this visit.  Physical Exam  Ortho Exam  Examination left hip shows fully healed surgical scar.  Functional range of motion without pain.  Specialty Comments:  No specialty comments available.  Imaging: XR Pelvis 1-2 Views  Result Date: 01/31/2023 Stable left total hip replacement in good position without any subsidence or loosening.  There is presence of heterotopic ossification in the abductors and a traction osteophyte of the lesser trochanter.    PMFS History: Patient Active Problem List   Diagnosis Date Noted   Status post total replacement of left  hip 01/30/2022   Primary osteoarthritis of left hip 01/29/2022   Past Medical History:  Diagnosis Date   ED (erectile dysfunction)    Hyperlipidemia    Obesity    Pneumonia     Family History  Problem Relation Age of Onset   Ovarian cancer Mother    Diabetes Father    Blindness Father    Stroke Maternal Grandmother    Colon cancer Neg Hx    Colon polyps Neg Hx    Esophageal cancer Neg Hx    Stomach cancer Neg Hx    Rectal cancer Neg Hx     Past Surgical History:  Procedure Laterality Date   FOOT FRACTURE SURGERY Left    FRACTURE SURGERY     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   Procedure: LEFT TOTAL HIP ARTHROPLASTY ANTERIOR APPROACH;  Surgeon: Leandrew Koyanagi, MD;  Location: [facility];  Service: Orthopedics;  Laterality: Left;  3-C   Social History   Occupational History   Not on file  Tobacco Use   Smoking status: Never   Smokeless tobacco: Never  Vaping Use   Vaping Use: Never used  Substance and Sexual Activity   Alcohol use: Yes    Alcohol/week: 4.0 standard drinks of alcohol    Types: 4 Shots of liquor per week    Comment: "social"   Drug  use: No   Sexual activity: Not on file

## 2023-02-22 ENCOUNTER — Encounter: Payer: Self-pay | Admitting: Adult Health

## 2023-02-22 MED ORDER — TIRZEPATIDE 12.5 MG/0.5ML ~~LOC~~ SOAJ: 12.5000 mg | SUBCUTANEOUS | 0 refills | Status: DC

## 2023-02-22 NOTE — Telephone Encounter (Signed)
Please advise 

## 2023-05-11 ENCOUNTER — Other Ambulatory Visit: Payer: Self-pay | Admitting: Adult Health

## 2023-05-11 NOTE — Telephone Encounter (Signed)
Patient need to schedule an ov for more refills. 

## 2023-05-16 ENCOUNTER — Telehealth: Payer: Self-pay | Admitting: Adult Health

## 2023-05-16 MED ORDER — TIRZEPATIDE 12.5 MG/0.5ML ~~LOC~~ SOAJ: 12.5000 mg | SUBCUTANEOUS | 0 refills | Status: DC

## 2023-05-16 NOTE — Telephone Encounter (Signed)
Patient need to schedule an ov for more refills. 

## 2023-05-16 NOTE — Telephone Encounter (Signed)
Patient is requesting per MyChart a refill on tirzepatide [facility]) 12.5 MG/0.5ML Pen.

## 2023-05-18 NOTE — Telephone Encounter (Signed)
Pt has been scheduled.  °

## 2023-05-22 ENCOUNTER — Telehealth: Payer: BC Managed Care – PPO | Admitting: Adult Health

## 2023-05-24 ENCOUNTER — Telehealth: Payer: BC Managed Care – PPO | Admitting: Adult Health

## 2023-05-25 ENCOUNTER — Other Ambulatory Visit: Payer: Self-pay | Admitting: Adult Health

## 2023-05-31 DIAGNOSIS — L218 Other seborrheic dermatitis: Secondary | ICD-10-CM | POA: Diagnosis not present

## 2023-06-05 ENCOUNTER — Telehealth: Payer: BC Managed Care – PPO | Admitting: Adult Health

## 2023-06-07 ENCOUNTER — Other Ambulatory Visit (HOSPITAL_BASED_OUTPATIENT_CLINIC_OR_DEPARTMENT_OTHER): Payer: Self-pay

## 2023-06-07 ENCOUNTER — Ambulatory Visit (INDEPENDENT_AMBULATORY_CARE_PROVIDER_SITE_OTHER): Payer: PRIVATE HEALTH INSURANCE | Admitting: Adult Health

## 2023-06-07 ENCOUNTER — Encounter: Payer: Self-pay | Admitting: Adult Health

## 2023-06-07 VITALS — BP 120/88 | HR 88 | Temp 98.5°F | Ht 70.0 in | Wt 235.0 lb

## 2023-06-07 DIAGNOSIS — E668 Other obesity: Secondary | ICD-10-CM | POA: Diagnosis not present

## 2023-06-07 DIAGNOSIS — E291 Testicular hypofunction: Secondary | ICD-10-CM | POA: Diagnosis not present

## 2023-06-07 DIAGNOSIS — Z125 Encounter for screening for malignant neoplasm of prostate: Secondary | ICD-10-CM

## 2023-06-07 DIAGNOSIS — N529 Male erectile dysfunction, unspecified: Secondary | ICD-10-CM | POA: Diagnosis not present

## 2023-06-07 DIAGNOSIS — E7439 Other disorders of intestinal carbohydrate absorption: Secondary | ICD-10-CM

## 2023-06-07 DIAGNOSIS — E782 Mixed hyperlipidemia: Secondary | ICD-10-CM | POA: Diagnosis not present

## 2023-06-07 DIAGNOSIS — Z0000 Encounter for general adult medical examination without abnormal findings: Secondary | ICD-10-CM

## 2023-06-07 LAB — LIPID PANEL
Cholesterol: 205 mg/dL — ABNORMAL HIGH (ref 0–200)
HDL: 70.3 mg/dL (ref >39.00)
LDL Cholesterol: 111 mg/dL — ABNORMAL HIGH (ref 0–99)
NonHDL: 134.81
Total CHOL/HDL Ratio: 3
Triglycerides: 118 mg/dL (ref 0.0–149.0)
VLDL: 23.6 mg/dL (ref 0.0–40.0)

## 2023-06-07 LAB — CBC
HCT: 43.5 % (ref 39.0–52.0)
Hemoglobin: 14.4 g/dL (ref 13.0–17.0)
MCHC: 33 g/dL (ref 30.0–36.0)
MCV: 98 fl (ref 78.0–100.0)
Platelets: 302 10*3/uL (ref 150.0–400.0)
RBC: 4.44 Mil/uL (ref 4.22–5.81)
RDW: 13.9 % (ref 11.5–15.5)
WBC: 3.3 10*3/uL — ABNORMAL LOW (ref 4.0–10.5)

## 2023-06-07 LAB — COMPREHENSIVE METABOLIC PANEL
ALT: 19 U/L (ref 0–53)
AST: 24 U/L (ref 0–37)
Albumin: 4.3 g/dL (ref 3.5–5.2)
Alkaline Phosphatase: 94 U/L (ref 39–117)
BUN: 9 mg/dL (ref 6–23)
CO2: 29 mEq/L (ref 19–32)
Calcium: 9.2 mg/dL (ref 8.4–10.5)
Chloride: 105 mEq/L (ref 96–112)
Creatinine, Ser: 0.99 mg/dL (ref 0.40–1.50)
GFR: 87.44 mL/min (ref >60.00)
Glucose, Bld: 84 mg/dL (ref 70–99)
Potassium: 4.6 mEq/L (ref 3.5–5.1)
Sodium: 141 mEq/L (ref 135–145)
Total Bilirubin: 0.8 mg/dL (ref 0.2–1.2)
Total Protein: 7 g/dL (ref 6.0–8.3)

## 2023-06-07 LAB — TSH: TSH: 2.17 u[IU]/mL (ref 0.35–5.50)

## 2023-06-07 LAB — HEMOGLOBIN A1C: Hgb A1c MFr Bld: 4.8 % (ref 4.6–6.5)

## 2023-06-07 LAB — PSA: PSA: 0.75 ng/mL (ref 0.10–4.00)

## 2023-06-07 MED ORDER — TIRZEPATIDE 15 MG/0.5ML ~~LOC~~ SOAJ
15.0000 mg | SUBCUTANEOUS | 1 refills | Status: DC
Start: 2023-06-07 — End: 2023-08-21
  Filled 2023-06-07: qty 2, 28d supply, fill #0
  Filled 2023-07-01 – 2023-07-11 (×2): qty 2, 28d supply, fill #1
  Filled 2023-08-03 – 2023-08-08 (×7): qty 2, 28d supply, fill #2
  Filled 2023-08-09: qty 6, 83d supply, fill #2
  Filled 2023-08-09: qty 2, 28d supply, fill #2
  Filled 2023-08-09: qty 6, 83d supply, fill #2
  Filled 2023-08-14: qty 2, 28d supply, fill #2

## 2023-06-07 NOTE — Patient Instructions (Addendum)
It was great seeing you today   We will follow up with you regarding your lab work   Please let me know if you need anything   Please follow up in 6 months   Work on weight loss through diet and exercise

## 2023-06-07 NOTE — Progress Notes (Signed)
Subjective:    Patient ID: Jesus Allen, male    DOB: 02/11/1971, 52 y.o.   MRN: [id number]  HPI Patient presents for yearly preventative medicine examination. He is a pleasant 52 year old male who  has a past medical history of ED (erectile dysfunction), Hyperlipidemia, Obesity, and Pneumonia.  Glucose Intolerance -managed currently with Mounjaro 15 mg a week he has been out of the medication for the last two weeks.   He has been doing well with this medication with no side effects.  He does report that the medication decreases his appetite but when he does eat it is usually high calorie meal.  Lab Results  Component Value Date   HGBA1C 4.7 04/14/2022   Hyperlipidemia-takes Crestor 5 mg daily.  He denies myalgia or fatigue Lab Results  Component Value Date   CHOL 187 04/14/2022   HDL 65.10 04/14/2022   LDLCALC 104 (H) 04/14/2022   TRIG 87.0 04/14/2022   CHOLHDL 3 04/14/2022   Erectile dysfunction-takes Viagra as needed  Hypogonadism-is managed by urology with testosterone injections.  Obesity - He is walking 2 miles a day. His diet has been hit or miss, he continues to snack a lot of candy.   Wt Readings from Last 10 Encounters:  06/07/23 235 lb (106.6 kg)  04/14/22 232 lb (105.2 kg)  02/28/22 238 lb (108 kg)  01/30/22 250 lb (113.4 kg)  01/27/22 256 lb (116.1 kg)  12/09/21 248 lb (112.5 kg)  10/04/21 250 lb (113.4 kg)  09/06/21 255 lb (115.7 kg)  08/09/21 259 lb (117.5 kg)  07/25/21 252 lb (114.3 kg)   All immunizations and health maintenance protocols were reviewed with the patient and needed orders were placed.  Appropriate screening laboratory values were ordered for the patient including screening of hyperlipidemia, renal function and hepatic function. If indicated by BPH, a PSA was ordered.  Medication reconciliation,  past medical history, social history, problem list and allergies were reviewed in detail with the patient  Goals were established  with regard to weight loss, exercise, and  diet in compliance with medications.   He is up to date on routine colon cancer screening.    Review of Systems  Constitutional: Negative.   HENT: Negative.    Eyes: Negative.   Respiratory: Negative.    Cardiovascular: Negative.   Gastrointestinal: Negative.   Endocrine: Negative.   Genitourinary: Negative.   Musculoskeletal: Negative.   Skin: Negative.   Allergic/Immunologic: Negative.   Neurological: Negative.   Hematological: Negative.   Psychiatric/Behavioral: Negative.    All other systems reviewed and are negative.  Past Medical History:  Diagnosis Date   ED (erectile dysfunction)    Hyperlipidemia    Obesity    Pneumonia     Social History   Socioeconomic History   Marital status: Married    Spouse name: Not on file   Number of children: Not on file   Years of education: Not on file   Highest education level: Master's degree (e.g., MA, MS, MEng, MEd, MSW, MBA)  Occupational History   Not on file  Tobacco Use   Smoking status: Never   Smokeless tobacco: Never  Vaping Use   Vaping status: Never Used  Substance and Sexual Activity   Alcohol use: Yes    Alcohol/week: 4.0 standard drinks of alcohol    Types: 4 Shots of liquor per week    Comment: "social"   Drug use: No   Sexual activity: Not on file  Other  Topics Concern   Not on file  Social History Narrative   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   Married    Two children ( 13 and 17)    Social Determinants of Health   Financial Resource Strain: Patient Declined (06/03/2023)   Overall Financial Resource Strain (CARDIA)    Difficulty of Paying Living Expenses: Patient declined  Food Insecurity: Unknown (06/03/2023)   Hunger Vital Sign    Worried About Running Out of Food in the Last Year: Not on file    Ran Out of Food in the Last Year: Patient declined  Transportation Needs: Unknown (06/03/2023)   PRAPARE - Administrator, Civil Service  (Medical): Not on file    Lack of Transportation (Non-Medical): Patient declined  Physical Activity: Sufficiently Active (06/03/2023)   Exercise Vital Sign    Days of Exercise per Week: 5 days    Minutes of Exercise per Session: 30 min  Stress: No Stress Concern Present (06/03/2023)   [employer] of Occupational Health - Occupational Stress Questionnaire    Feeling of Stress : Not at all  Social Connections: Unknown (06/03/2023)   Social Connection and Isolation Panel [NHANES]    Frequency of Communication with Friends and Family: Three times a week    Frequency of Social Gatherings with Friends and Family: Three times a week    Attends Religious Services: Not on file    Active Member of Clubs or Organizations: Yes    Attends Banker Meetings: More than 4 times per year    Marital Status: Patient declined  Intimate Partner Violence: Not on file    Past Surgical History:  Procedure Laterality Date   FOOT FRACTURE SURGERY Left    FRACTURE SURGERY     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   Procedure: LEFT TOTAL HIP ARTHROPLASTY ANTERIOR APPROACH;  Surgeon: Tarry Kos, MD;  Location: [facility] OR;  Service: Orthopedics;  Laterality: Left;  3-C    Family History  Problem Relation Age of Onset   Ovarian cancer Mother    Diabetes Father    Blindness Father    Stroke Maternal Grandmother    Colon cancer Neg Hx    Colon polyps Neg Hx    Esophageal cancer Neg Hx    Stomach cancer Neg Hx    Rectal cancer Neg Hx     No Known Allergies  Current Outpatient Medications on File Prior to Visit  Medication Sig Dispense Refill   diclofenac (VOLTAREN) 75 MG EC tablet Take 1 tablet (75 mg total) by mouth 2 (two) times daily as needed. 60 tablet 2   ketoconazole (NIZORAL) 2 % cream Apply 1 application. topically daily.     ketoconazole (NIZORAL) 2 % shampoo Apply 1 application. topically 2 (two) times a week.     Multiple Vitamins-Minerals (MULTIVITAMIN WITH MINERALS) tablet  Take 1 tablet by mouth daily. One a day men     rosuvastatin (CRESTOR) 5 MG tablet TAKE 1 TABLET (5 MG TOTAL) BY MOUTH DAILY. KEEP SCHEDULED APPT WITH Ashunti Schofield. 90 tablet 1   sildenafil (VIAGRA) 100 MG tablet Take 100 mg by mouth daily as needed for erectile dysfunction.     tamsulosin (FLOMAX) 0.4 MG CAPS capsule Take 0.4 mg by mouth at bedtime.     testosterone cypionate (DEPOTESTOSTERONE CYPIONATE) 200 MG/ML injection Inject 200 mg into the skin every 14 (fourteen) days.     amoxicillin (AMOXIL) 500 MG capsule Take 4 pills one  hour prior to dental work (Patient not taking: Reported on 06/07/2023) 8 capsule 0   MOUNJARO 15 MG/0.5ML Pen INJECT 15 MG UNDER THE SKIN WEEKLY 6 mL 3   tirzepatide (MOUNJARO) 12.5 MG/0.5ML Pen Inject 12.5 mg into the skin once a week. 6 mL 0   No current facility-administered medications on file prior to visit.    BP 120/88   Pulse 88   Temp 98.5 F (36.9 C) (Oral)   Ht 5\' 10"  (1.778 m)   Wt 235 lb (106.6 kg)   SpO2 95%   BMI 33.72 kg/m       Objective:   Physical Exam Vitals and nursing note reviewed.  Constitutional:      General: He is not in acute distress.    Appearance: Normal appearance. He is obese. He is not ill-appearing.  HENT:     Head: Normocephalic and atraumatic.     Right Ear: Tympanic membrane, ear canal and external ear normal. There is no impacted cerumen.     Left Ear: Tympanic membrane, ear canal and external ear normal. There is no impacted cerumen.     Nose: Nose normal. No congestion or rhinorrhea.     Mouth/Throat:     Mouth: Mucous membranes are moist.     Pharynx: Oropharynx is clear.  Eyes:     Extraocular Movements: Extraocular movements intact.     Conjunctiva/sclera: Conjunctivae normal.     Pupils: Pupils are equal, round, and reactive to light.  Neck:     Vascular: No carotid bruit.  Cardiovascular:     Rate and Rhythm: Normal rate and regular rhythm.     Pulses: Normal pulses.     Heart sounds: No murmur heard.     No friction rub. No gallop.  Pulmonary:     Effort: Pulmonary effort is normal.     Breath sounds: Normal breath sounds.  Abdominal:     General: Abdomen is flat. Bowel sounds are normal. There is no distension.     Palpations: Abdomen is soft. There is no mass.     Tenderness: There is no abdominal tenderness. There is no guarding or rebound.     Hernia: No hernia is present.  Musculoskeletal:        General: Normal range of motion.     Cervical back: Normal range of motion and neck supple.  Lymphadenopathy:     Cervical: No cervical adenopathy.  Skin:    General: Skin is warm and dry.     Capillary Refill: Capillary refill takes less than 2 seconds.  Neurological:     General: No focal deficit present.     Mental Status: He is alert and oriented to person, place, and time.  Psychiatric:        Mood and Affect: Mood normal.        Behavior: Behavior normal.        Thought Content: Thought content normal.        Judgment: Judgment normal.       Assessment & Plan:  1. Routine general medical examination at a health care facility Today patient counseled on age appropriate routine health concerns for screening and prevention, each reviewed and up to date or declined. Immunizations reviewed and up to date or declined. Labs ordered and reviewed. Risk factors for depression reviewed and negative. Hearing function and visual acuity are intact. ADLs screened and addressed as needed. Functional ability and level of safety reviewed and appropriate. Education, counseling and referrals performed  based on assessed risks today. Patient provided with a copy of personalized plan for preventive services. - Follow up in one year for CPE  - Work on weight loss through diet and exericse  2. Glucose intolerance  - Lipid panel; Future - TSH; Future - CBC; Future - Comprehensive metabolic panel; Future - Hemoglobin A1c; Future - tirzepatide (MOUNJARO) 15 MG/0.5ML Pen; Inject 15 mg into the skin  once a week.  Dispense: 6 mL; Refill: 1 - Follow up in 6 months   3. Other obesity - Encouraged healthier eating. Continue to walk daily  - Lipid panel; Future - TSH; Future - CBC; Future - Comprehensive metabolic panel; Future - Hemoglobin A1c; Future - tirzepatide (MOUNJARO) 15 MG/0.5ML Pen; Inject 15 mg into the skin once a week.  Dispense: 6 mL; Refill: 1  4. Prostate cancer screening  - PSA; Future  5. Mixed hyperlipidemia - Continue statin  - Work on weight loss  - Lipid panel; Future - TSH; Future - CBC; Future - Comprehensive metabolic panel; Future  6. Erectile dysfunction, unspecified erectile dysfunction type - Continue with Viagra PRN  - Lipid panel; Future - TSH; Future - CBC; Future - Comprehensive metabolic panel; Future  7. Hypogonadism in male - Per urology  - Lipid panel; Future - TSH; Future - CBC; Future - Comprehensive metabolic panel; Future

## 2023-06-08 ENCOUNTER — Other Ambulatory Visit (HOSPITAL_BASED_OUTPATIENT_CLINIC_OR_DEPARTMENT_OTHER): Payer: Self-pay

## 2023-07-10 ENCOUNTER — Encounter (HOSPITAL_COMMUNITY): Payer: Self-pay

## 2023-07-11 ENCOUNTER — Other Ambulatory Visit (HOSPITAL_BASED_OUTPATIENT_CLINIC_OR_DEPARTMENT_OTHER): Payer: Self-pay

## 2023-07-11 ENCOUNTER — Encounter: Payer: Self-pay | Admitting: Adult Health

## 2023-07-11 ENCOUNTER — Other Ambulatory Visit: Payer: Self-pay

## 2023-07-11 ENCOUNTER — Other Ambulatory Visit (HOSPITAL_COMMUNITY): Payer: Self-pay

## 2023-07-11 NOTE — Telephone Encounter (Signed)
Did not see a PA faxed over. Spoke to pt and advised that the pharmacy will send over the PA. Pt verbalized understanding.

## 2023-07-12 ENCOUNTER — Other Ambulatory Visit (HOSPITAL_COMMUNITY): Payer: Self-pay

## 2023-07-12 NOTE — Telephone Encounter (Signed)
PA has been started

## 2023-07-13 ENCOUNTER — Other Ambulatory Visit: Payer: Self-pay | Admitting: Adult Health

## 2023-07-13 ENCOUNTER — Other Ambulatory Visit (HOSPITAL_BASED_OUTPATIENT_CLINIC_OR_DEPARTMENT_OTHER): Payer: Self-pay

## 2023-07-17 ENCOUNTER — Other Ambulatory Visit (HOSPITAL_BASED_OUTPATIENT_CLINIC_OR_DEPARTMENT_OTHER): Payer: Self-pay

## 2023-07-18 ENCOUNTER — Other Ambulatory Visit (HOSPITAL_BASED_OUTPATIENT_CLINIC_OR_DEPARTMENT_OTHER): Payer: Self-pay

## 2023-08-03 ENCOUNTER — Other Ambulatory Visit (HOSPITAL_BASED_OUTPATIENT_CLINIC_OR_DEPARTMENT_OTHER): Payer: Self-pay

## 2023-08-04 ENCOUNTER — Other Ambulatory Visit (HOSPITAL_BASED_OUTPATIENT_CLINIC_OR_DEPARTMENT_OTHER): Payer: Self-pay

## 2023-08-07 ENCOUNTER — Other Ambulatory Visit (HOSPITAL_BASED_OUTPATIENT_CLINIC_OR_DEPARTMENT_OTHER): Payer: Self-pay

## 2023-08-08 ENCOUNTER — Other Ambulatory Visit (HOSPITAL_BASED_OUTPATIENT_CLINIC_OR_DEPARTMENT_OTHER): Payer: Self-pay

## 2023-08-08 ENCOUNTER — Other Ambulatory Visit (HOSPITAL_COMMUNITY): Payer: Self-pay

## 2023-08-09 ENCOUNTER — Other Ambulatory Visit (HOSPITAL_BASED_OUTPATIENT_CLINIC_OR_DEPARTMENT_OTHER): Payer: Self-pay

## 2023-08-14 ENCOUNTER — Other Ambulatory Visit (HOSPITAL_BASED_OUTPATIENT_CLINIC_OR_DEPARTMENT_OTHER): Payer: Self-pay

## 2023-08-15 ENCOUNTER — Other Ambulatory Visit: Payer: Self-pay

## 2023-08-15 ENCOUNTER — Other Ambulatory Visit (HOSPITAL_BASED_OUTPATIENT_CLINIC_OR_DEPARTMENT_OTHER): Payer: Self-pay

## 2023-08-16 ENCOUNTER — Other Ambulatory Visit (HOSPITAL_BASED_OUTPATIENT_CLINIC_OR_DEPARTMENT_OTHER): Payer: Self-pay

## 2023-08-18 ENCOUNTER — Encounter: Payer: Self-pay | Admitting: Adult Health

## 2023-08-20 ENCOUNTER — Other Ambulatory Visit: Payer: Self-pay

## 2023-08-20 NOTE — Telephone Encounter (Addendum)
Ok to fill? I'm unable to fill on my end.

## 2023-08-21 ENCOUNTER — Other Ambulatory Visit: Payer: Self-pay | Admitting: Adult Health

## 2023-08-21 DIAGNOSIS — E7439 Other disorders of intestinal carbohydrate absorption: Secondary | ICD-10-CM

## 2023-08-21 MED ORDER — TIRZEPATIDE 15 MG/0.5ML ~~LOC~~ SOAJ
15.0000 mg | SUBCUTANEOUS | 0 refills | Status: DC
Start: 2023-08-21 — End: 2023-11-09

## 2023-09-28 ENCOUNTER — Other Ambulatory Visit (HOSPITAL_BASED_OUTPATIENT_CLINIC_OR_DEPARTMENT_OTHER): Payer: Self-pay

## 2023-11-09 ENCOUNTER — Other Ambulatory Visit: Payer: Self-pay | Admitting: Adult Health

## 2023-11-09 DIAGNOSIS — E7439 Other disorders of intestinal carbohydrate absorption: Secondary | ICD-10-CM

## 2023-11-13 MED ORDER — TIRZEPATIDE 15 MG/0.5ML ~~LOC~~ SOAJ: 15.0000 mg | SUBCUTANEOUS | 0 refills | Status: DC

## 2023-12-07 ENCOUNTER — Ambulatory Visit (INDEPENDENT_AMBULATORY_CARE_PROVIDER_SITE_OTHER): Payer: BC Managed Care – PPO | Admitting: Adult Health

## 2023-12-07 ENCOUNTER — Other Ambulatory Visit (HOSPITAL_COMMUNITY): Payer: Self-pay

## 2023-12-07 ENCOUNTER — Encounter: Payer: Self-pay | Admitting: Adult Health

## 2023-12-07 ENCOUNTER — Telehealth: Payer: Self-pay | Admitting: Pharmacy Technician

## 2023-12-07 VITALS — BP 110/80 | HR 96 | Temp 98.3°F | Ht 70.0 in | Wt 247.0 lb

## 2023-12-07 DIAGNOSIS — Z6835 Body mass index (BMI) 35.0-35.9, adult: Secondary | ICD-10-CM | POA: Diagnosis not present

## 2023-12-07 DIAGNOSIS — E7439 Other disorders of intestinal carbohydrate absorption: Secondary | ICD-10-CM

## 2023-12-07 DIAGNOSIS — E66811 Obesity, class 1: Secondary | ICD-10-CM

## 2023-12-07 DIAGNOSIS — R7303 Prediabetes: Secondary | ICD-10-CM

## 2023-12-07 LAB — POCT GLYCOSYLATED HEMOGLOBIN (HGB A1C): Hemoglobin A1C: 4.9 % (ref 4.0–5.6)

## 2023-12-07 MED ORDER — TIRZEPATIDE 15 MG/0.5ML ~~LOC~~ SOAJ
15.0000 mg | SUBCUTANEOUS | 0 refills | Status: DC
Start: 2023-12-07 — End: 2024-05-01

## 2023-12-07 NOTE — Telephone Encounter (Signed)
Pharmacy Patient Advocate Encounter   Received notification from CoverMyMeds that prior authorization for Mounjaro 15MG /0.5ML auto-injectors is required/requested.   Insurance verification completed.   The patient is insured through [facility] .   Per test claim: PA required; PA submitted to above mentioned insurance via CoverMyMeds Key/confirmation #/EOC [id number] Status is pending

## 2023-12-07 NOTE — Progress Notes (Signed)
Subjective:    Patient ID: Jesus Allen, male    DOB: 01-21-1971, 53 y.o.   MRN: [id number]  HPI  53 year old male who  has a past medical history of ED (erectile dysfunction), Hyperlipidemia, Obesity, and Pneumonia.  He presents to the office today for six month follow up regarding prediabetes and obesity   Prediabetes  -managed currently with Mounjaro 15 mg a week he has been out of the medication for the last 4 months. He reports that [facility] not [facility] due to back order. We sent in a new prescription to [facility] but they would not fill it due to being a 30 day prescription.   Obesity - He is back to traveling a lot for work and has not been eating well or exercising. His diet has been hit or miss, he continues to snack a lot of candy.   Wt Readings from Last 10 Encounters:  12/07/23 247 lb (112 kg)  06/07/23 235 lb (106.6 kg)  04/14/22 232 lb (105.2 kg)  02/28/22 238 lb (108 kg)  01/30/22 250 lb (113.4 kg)  01/27/22 256 lb (116.1 kg)  12/09/21 248 lb (112.5 kg)  10/04/21 250 lb (113.4 kg)  09/06/21 255 lb (115.7 kg)  08/09/21 259 lb (117.5 kg)   Review of Systems  See HPI  Past Medical History:  Diagnosis Date   ED (erectile dysfunction)    Hyperlipidemia    Obesity    Pneumonia     Social History   Socioeconomic History   Marital status: Married    Spouse name: Not on file   Number of children: Not on file   Years of education: Not on file   Highest education level: Master's degree (e.g., MA, MS, MEng, MEd, MSW, MBA)  Occupational History   Not on file  Tobacco Use   Smoking status: Never   Smokeless tobacco: Never  Vaping Use   Vaping status: Never Used  Substance and Sexual Activity   Alcohol use: Yes    Alcohol/week: 4.0 standard drinks of alcohol    Types: 4 Shots of liquor per week    Comment: "social"   Drug use: No   Sexual activity: Not on file  Other Topics Concern   Not on  file  Social History Narrative   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   Married    Two children ( 13 and 17)    Social Drivers of Corporate investment banker Strain: Patient Declined (12/03/2023)   Overall Financial Resource Strain (CARDIA)    Difficulty of Paying Living Expenses: Patient declined  Food Insecurity: Patient Declined (12/03/2023)   Hunger Vital Sign    Worried About Running Out of Food in the Last Year: Patient declined    Ran Out of Food in the Last Year: Patient declined  Transportation Needs: Patient Declined (12/03/2023)   PRAPARE - Administrator, Civil Service (Medical): Patient declined    Lack of Transportation (Non-Medical): Patient declined  Physical Activity: Insufficiently Active (12/03/2023)   Exercise Vital Sign    Days of Exercise per Week: 3 days    Minutes of Exercise per Session: 30 min  Stress: No Stress Concern Present (12/03/2023)   [employer] of Occupational Health - Occupational Stress Questionnaire    Feeling of Stress : Not at all  Social Connections: Unknown (12/03/2023)   Social Connection and Isolation Panel [  NHANES]    Frequency of Communication with Friends and Family: Patient declined    Frequency of Social Gatherings with Friends and Family: Patient declined    Attends Religious Services: Patient declined    Database administrator or Organizations: Yes    Attends Engineer, structural: More than 4 times per year    Marital Status: Patient declined  Catering manager Violence: Not on file    Past Surgical History:  Procedure Laterality Date   FOOT FRACTURE SURGERY Left    FRACTURE SURGERY     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   Procedure: LEFT TOTAL HIP ARTHROPLASTY ANTERIOR APPROACH;  Surgeon: Tarry Kos, MD;  Location: [facility] OR;  Service: Orthopedics;  Laterality: Left;  3-C    Family History  Problem Relation Age of Onset   Ovarian cancer Mother    Diabetes Father    Blindness Father    Stroke  Maternal Grandmother    Colon cancer Neg Hx    Colon polyps Neg Hx    Esophageal cancer Neg Hx    Stomach cancer Neg Hx    Rectal cancer Neg Hx     No Known Allergies  Current Outpatient Medications on File Prior to Visit  Medication Sig Dispense Refill   amoxicillin (AMOXIL) 500 MG capsule Take 4 pills one hour prior to dental work 8 capsule 0   ketoconazole (NIZORAL) 2 % cream Apply 1 application. topically daily.     ketoconazole (NIZORAL) 2 % shampoo Apply 1 application. topically 2 (two) times a week.     Multiple Vitamins-Minerals (MULTIVITAMIN WITH MINERALS) tablet Take 1 tablet by mouth daily. One a day men     rosuvastatin (CRESTOR) 5 MG tablet TAKE 1 TABLET (5 MG TOTAL) BY MOUTH DAILY. KEEP SCHEDULED APPT WITH Zachari Alberta. 90 tablet 1   sildenafil (VIAGRA) 100 MG tablet Take 100 mg by mouth daily as needed for erectile dysfunction.     tamsulosin (FLOMAX) 0.4 MG CAPS capsule Take 0.4 mg by mouth at bedtime.     testosterone cypionate (DEPOTESTOSTERONE CYPIONATE) 200 MG/ML injection Inject 200 mg into the skin every 14 (fourteen) days.     tirzepatide (MOUNJARO) 15 MG/0.5ML Pen Inject 15 mg into the skin once a week. 6 mL 0   No current facility-administered medications on file prior to visit.    BP 110/80   Pulse 96   Temp 98.3 F (36.8 C) (Oral)   Ht 5\' 10"  (1.778 m)   Wt 247 lb (112 kg)   SpO2 95%   BMI 35.44 kg/m       Objective:   Physical Exam Vitals and nursing note reviewed.  Constitutional:      Appearance: Normal appearance. He is obese.  Cardiovascular:     Rate and Rhythm: Normal rate and regular rhythm.     Pulses: Normal pulses.     Heart sounds: Normal heart sounds.  Pulmonary:     Effort: Pulmonary effort is normal.     Breath sounds: Normal breath sounds.  Skin:    General: Skin is warm and dry.  Neurological:     General: No focal deficit present.     Mental Status: He is alert and oriented to person, place, and time.  Psychiatric:         Mood and Affect: Mood normal.        Behavior: Behavior normal.        Thought Content: Thought content normal.  Judgment: Judgment normal.       Assessment & Plan:  1. Prediabetes (Primary)  - tirzepatide (MOUNJARO) 15 MG/0.5ML Pen; Inject 15 mg into the skin once a week.  Dispense: 6 mL; Refill: 0 - POC HgB A1c- 4.9  - Follow up in 6 months  2. Class 1 obesity  - tirzepatide (MOUNJARO) 15 MG/0.5ML Pen; Inject 15 mg into the skin once a week.  Dispense: 6 mL; Refill: 0  3. BMI 35.0-35.9,adult - Needs to start exercising and eating healthy again.  - tirzepatide (MOUNJARO) 15 MG/0.5ML Pen; Inject 15 mg into the skin once a week.  Dispense: 6 mL; Refill: 0  4. Glucose intolerance  - tirzepatide (MOUNJARO) 15 MG/0.5ML Pen; Inject 15 mg into the skin once a week.  Dispense: 6 mL; Refill: 0 - POC HgB A1c  Shirline Frees, NP

## 2023-12-10 NOTE — Telephone Encounter (Signed)
Pharmacy Patient Advocate Encounter  Received notification from [facility] that Prior Authorization for [facility] 15MG /0.5ML auto-injectors  has been DENIED.  No reason given; No denial letter received via Fax or CMM. It has been requested and will be uploaded to the media tab once received.   PA #/Case ID/Reference #:  [id number]

## 2023-12-11 NOTE — Telephone Encounter (Signed)
Cab someone help with this please.

## 2024-01-13 ENCOUNTER — Other Ambulatory Visit: Payer: Self-pay | Admitting: Adult Health

## 2024-01-30 NOTE — Progress Notes (Unsigned)
   Post-Op Visit Note   Patient: Jesus Allen           Date of Birth: Apr 14, 1971           MRN: [id number] Visit Date: 02/01/2024 PCP: Shirline Frees, NP   Assessment & Plan:  Chief Complaint: No chief complaint on file. Visit Diagnoses: No diagnosis found.  Plan: ***  Follow-Up Instructions: No follow-ups on file.   Orders:  No orders of the defined types were placed in this encounter. No orders of the defined types were placed in this encounter.  Imaging: No results found.  PMFS History: Patient Active Problem List   Diagnosis Date Noted  . 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 Primary osteoarthritis of left hip 01/29/2022   Past Medical History:  Diagnosis Date  . ED (erectile dysfunction)   . Hyperlipidemia   . Obesity   . Pneumonia     Family History  Problem Relation Age of Onset  . Ovarian cancer Mother   . Diabetes Father   . Blindness Father   . Stroke Maternal Grandmother   . Colon cancer Neg Hx   . Colon polyps Neg Hx   . Esophageal cancer Neg Hx   . Stomach cancer Neg Hx   . Rectal cancer Neg Hx     Past Surgical History:  Procedure Laterality Date  . FOOT FRACTURE SURGERY Left   . FRACTURE SURGERY    . 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   Procedure: LEFT TOTAL HIP ARTHROPLASTY ANTERIOR APPROACH;  Surgeon: Tarry Kos, MD;  Location: [facility] OR;  Service: Orthopedics;  Laterality: Left;  3-C   Social History   Occupational History  . Not on file  Tobacco Use  . Smoking status: Never  . Smokeless tobacco: Never  Vaping Use  . Vaping status: Never Used  Substance and Sexual Activity  . Alcohol use: Yes    Alcohol/week: 4.0 standard drinks of alcohol    Types: 4 Shots of liquor per week    Comment: "social"  . Drug use: No  . Sexual activity: Not on file

## 2024-01-31 ENCOUNTER — Ambulatory Visit: Payer: BC Managed Care – PPO | Admitting: Orthopaedic Surgery

## 2024-02-01 ENCOUNTER — Encounter: Payer: Self-pay | Admitting: Orthopaedic Surgery

## 2024-02-01 ENCOUNTER — Other Ambulatory Visit (INDEPENDENT_AMBULATORY_CARE_PROVIDER_SITE_OTHER): Payer: Self-pay

## 2024-02-01 ENCOUNTER — Ambulatory Visit: Admitting: Orthopaedic Surgery

## 2024-02-01 DIAGNOSIS — Z96642 Presence of left artificial hip joint: Secondary | ICD-10-CM | POA: Diagnosis not present

## 2024-04-25 ENCOUNTER — Encounter: Payer: Self-pay | Admitting: Adult Health

## 2024-05-01 ENCOUNTER — Other Ambulatory Visit: Payer: Self-pay

## 2024-05-01 DIAGNOSIS — Z6835 Body mass index (BMI) 35.0-35.9, adult: Secondary | ICD-10-CM

## 2024-05-01 DIAGNOSIS — E66811 Obesity, class 1: Secondary | ICD-10-CM

## 2024-05-01 DIAGNOSIS — E7439 Other disorders of intestinal carbohydrate absorption: Secondary | ICD-10-CM

## 2024-05-01 DIAGNOSIS — R7303 Prediabetes: Secondary | ICD-10-CM

## 2024-05-01 MED ORDER — TIRZEPATIDE 15 MG/0.5ML ~~LOC~~ SOAJ: 15.0000 mg | SUBCUTANEOUS | 0 refills | Status: DC

## 2024-06-06 ENCOUNTER — Encounter: Payer: Self-pay | Admitting: Adult Health

## 2024-06-06 ENCOUNTER — Ambulatory Visit: Payer: Self-pay | Admitting: Adult Health

## 2024-06-06 ENCOUNTER — Ambulatory Visit: Payer: BC Managed Care – PPO | Admitting: Adult Health

## 2024-06-06 VITALS — BP 120/80 | HR 80 | Temp 98.1°F | Ht 70.0 in | Wt 228.0 lb

## 2024-06-06 DIAGNOSIS — R7303 Prediabetes: Secondary | ICD-10-CM

## 2024-06-06 DIAGNOSIS — E782 Mixed hyperlipidemia: Secondary | ICD-10-CM | POA: Diagnosis not present

## 2024-06-06 DIAGNOSIS — N400 Enlarged prostate without lower urinary tract symptoms: Secondary | ICD-10-CM

## 2024-06-06 DIAGNOSIS — Z0000 Encounter for general adult medical examination without abnormal findings: Secondary | ICD-10-CM

## 2024-06-06 DIAGNOSIS — Z6832 Body mass index (BMI) 32.0-32.9, adult: Secondary | ICD-10-CM | POA: Diagnosis not present

## 2024-06-06 DIAGNOSIS — E66811 Obesity, class 1: Secondary | ICD-10-CM | POA: Diagnosis not present

## 2024-06-06 DIAGNOSIS — E291 Testicular hypofunction: Secondary | ICD-10-CM

## 2024-06-06 LAB — LIPID PANEL
Cholesterol: 178 mg/dL (ref 0–200)
HDL: 78.6 mg/dL (ref >39.00)
LDL Cholesterol: 83 mg/dL (ref 0–99)
NonHDL: 99.24
Total CHOL/HDL Ratio: 2
Triglycerides: 80 mg/dL (ref 0.0–149.0)
VLDL: 16 mg/dL (ref 0.0–40.0)

## 2024-06-06 LAB — COMPREHENSIVE METABOLIC PANEL WITH GFR
ALT: 21 U/L (ref 0–53)
AST: 23 U/L (ref 0–37)
Albumin: 4.4 g/dL (ref 3.5–5.2)
Alkaline Phosphatase: 87 U/L (ref 39–117)
BUN: 8 mg/dL (ref 6–23)
CO2: 26 meq/L (ref 19–32)
Calcium: 8.8 mg/dL (ref 8.4–10.5)
Chloride: 106 meq/L (ref 96–112)
Creatinine, Ser: 0.84 mg/dL (ref 0.40–1.50)
GFR: 99.4 mL/min (ref >60.00)
Glucose, Bld: 71 mg/dL (ref 70–99)
Potassium: 4.1 meq/L (ref 3.5–5.1)
Sodium: 142 meq/L (ref 135–145)
Total Bilirubin: 1 mg/dL (ref 0.2–1.2)
Total Protein: 7 g/dL (ref 6.0–8.3)

## 2024-06-06 LAB — TSH: TSH: 2.06 u[IU]/mL (ref 0.35–5.50)

## 2024-06-06 LAB — CBC
HCT: 41.3 % (ref 39.0–52.0)
Hemoglobin: 14.1 g/dL (ref 13.0–17.0)
MCHC: 34.2 g/dL (ref 30.0–36.0)
MCV: 97.2 fl (ref 78.0–100.0)
Platelets: 260 K/uL (ref 150.0–400.0)
RBC: 4.24 Mil/uL (ref 4.22–5.81)
RDW: 13.5 % (ref 11.5–15.5)
WBC: 2.5 K/uL — ABNORMAL LOW (ref 4.0–10.5)

## 2024-06-06 LAB — PSA: PSA: 0.85 ng/mL (ref 0.10–4.00)

## 2024-06-06 LAB — HEMOGLOBIN A1C: Hgb A1c MFr Bld: 5.1 % (ref 4.6–6.5)

## 2024-06-06 NOTE — Patient Instructions (Signed)
It was great seeing you today  ° °We will follow up with you regarding your lab work  ° °Please let me know if you need anything  ° °I will see you back in 6 months  °

## 2024-06-06 NOTE — Progress Notes (Signed)
 Subjective:    Patient ID: Jesus Allen, male    DOB: Oct 29, 1971, 53 y.o.   MRN: [id number]  HPI Patient presents for yearly preventative medicine examination. He is a pleasant 53 year old male who  has a past medical history of ED (erectile dysfunction), Hyperlipidemia, Obesity, and Pneumonia.  Prediabetes  -managed currently with Mounjaro  15 mg weekly.  Lab Results  Component Value Date   HGBA1C 4.9 12/07/2023   HGBA1C 4.8 06/07/2023   HGBA1C 4.7 04/14/2022   Obesity -  He is eating healthier while on the road and is walking 3 miles a day for exercise.  Wt Readings from Last 3 Encounters:  06/06/24 228 lb (103.4 kg)  12/07/23 247 lb (112 kg)  06/07/23 235 lb (106.6 kg)   Hyperlipidemia - managed with Crestor  5 mg daily; he denies mylagia or fatigue  Lab Results  Component Value Date   CHOL 205 (H) 06/07/2023   HDL 70.30 06/07/2023   LDLCALC 111 (H) 06/07/2023   TRIG 118.0 06/07/2023   CHOLHDL 3 06/07/2023   BPH - managed with Flomax 0.4 feels like his symptoms are well controlled.   Low Testosterone  - managed with testosterone  replacement therapy by Urology; he has a follow up appointment in September   All immunizations and health maintenance protocols were reviewed with the patient and needed orders were placed.  Appropriate screening laboratory values were ordered for the patient including screening of hyperlipidemia, renal function and hepatic function.  Medication reconciliation,  past medical history, social history, problem list and allergies were reviewed in detail with the patient  Goals were established with regard to weight loss, exercise, and  diet in compliance with medications  Review of Systems  Constitutional: Negative.   HENT: Negative.    Eyes: Negative.   Respiratory: Negative.    Cardiovascular: Negative.   Gastrointestinal: Negative.   Endocrine: Negative.   Genitourinary: Negative.   Musculoskeletal: Negative.   Skin:  Negative.   Allergic/Immunologic: Negative.   Neurological: Negative.   Hematological: Negative.   Psychiatric/Behavioral: Negative.    All other systems reviewed and are negative.  Past Medical History:  Diagnosis Date   ED (erectile dysfunction)    Hyperlipidemia    Obesity    Pneumonia     Social History   Socioeconomic History   Marital status: Married    Spouse name: Not on file   Number of children: Not on file   Years of education: Not on file   Highest education level: Master's degree (e.g., MA, MS, MEng, MEd, MSW, MBA)  Occupational History   Not on file  Tobacco Use   Smoking status: Never   Smokeless tobacco: Never  Vaping Use   Vaping status: Never Used  Substance and Sexual Activity   Alcohol use: Yes    Alcohol/week: 4.0 standard drinks of alcohol    Types: 4 Shots of liquor per week    Comment: social   Drug use: No   Sexual activity: Not on file  Other Topics Concern   Not on file  Social History Narrative   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   Married    Two children ( 13 and 17)    Social Drivers of Corporate investment banker Strain: Patient Declined (06/05/2024)   Overall Financial Resource Strain (CARDIA)    Difficulty of Paying Living Expenses: Patient declined  Food Insecurity: Patient Declined (06/05/2024)   Hunger Vital Sign    Worried  About Running Out of Food in the Last Year: Patient declined    Ran Out of Food in the Last Year: Patient declined  Transportation Needs: Patient Declined (06/05/2024)   PRAPARE - Transportation    Lack of Transportation (Medical): Patient declined    Lack of Transportation (Non-Medical): Patient declined  Physical Activity: Sufficiently Active (06/05/2024)   Exercise Vital Sign    Days of Exercise per Week: 5 days    Minutes of Exercise per Session: 50 min  Stress: No Stress Concern Present (06/05/2024)   [employer] of Occupational Health - Occupational Stress Questionnaire    Feeling of  Stress: Only a little  Social Connections: Unknown (06/05/2024)   Social Connection and Isolation Panel    Frequency of Communication with Friends and Family: Three times a week    Frequency of Social Gatherings with Friends and Family: More than three times a week    Attends Religious Services: Patient declined    Active Member of Clubs or Organizations: Yes    Attends Engineer, structural: More than 4 times per year    Marital Status: Patient declined  Catering manager Violence: Not on file    Past Surgical History:  Procedure Laterality Date   FOOT FRACTURE SURGERY Left    FRACTURE SURGERY     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   Procedure: LEFT TOTAL HIP ARTHROPLASTY ANTERIOR APPROACH;  Surgeon: Jerri Kay HERO, MD;  Location: [facility] OR;  Service: Orthopedics;  Laterality: Left;  3-C    Family History  Problem Relation Age of Onset   Ovarian cancer Mother    Diabetes Father    Blindness Father    Stroke Maternal Grandmother    Colon cancer Neg Hx    Colon polyps Neg Hx    Esophageal cancer Neg Hx    Stomach cancer Neg Hx    Rectal cancer Neg Hx     No Known Allergies  Current Outpatient Medications on File Prior to Visit  Medication Sig Dispense Refill   ketoconazole (NIZORAL) 2 % cream Apply 1 application. topically daily.     ketoconazole (NIZORAL) 2 % shampoo Apply 1 application. topically 2 (two) times a week.     Multiple Vitamins-Minerals (MULTIVITAMIN WITH MINERALS) tablet Take 1 tablet by mouth daily. One a day men     rosuvastatin  (CRESTOR ) 5 MG tablet TAKE 1 TABLET (5 MG TOTAL) BY MOUTH DAILY. KEEP SCHEDULED APPT WITH Murice Barbar. 90 tablet 1   sildenafil (VIAGRA) 100 MG tablet Take 100 mg by mouth daily as needed for erectile dysfunction.     tirzepatide  (MOUNJARO ) 15 MG/0.5ML Pen Inject 15 mg into the skin once a week. 6 mL 0   tamsulosin (FLOMAX) 0.4 MG CAPS capsule Take 0.4 mg by mouth at bedtime. (Patient not taking: Reported on 06/06/2024)     testosterone   cypionate (DEPOTESTOSTERONE CYPIONATE) 200 MG/ML injection Inject 200 mg into the skin every 14 (fourteen) days. (Patient not taking: Reported on 06/06/2024)     No current facility-administered medications on file prior to visit.    BP 120/80   Pulse 80   Temp 98.1 F (36.7 C) (Oral)   Ht 5' 10 (1.778 m)   Wt 228 lb (103.4 kg)   SpO2 97%   BMI 32.71 kg/m       Objective:   Physical Exam Vitals and nursing note reviewed.  Constitutional:      General: He is not in acute distress.    Appearance: Normal appearance. He  is not ill-appearing.  HENT:     Head: Normocephalic and atraumatic.     Right Ear: Tympanic membrane, ear canal and external ear normal. There is no impacted cerumen.     Left Ear: Tympanic membrane, ear canal and external ear normal. There is no impacted cerumen.     Nose: Nose normal. No congestion or rhinorrhea.     Mouth/Throat:     Mouth: Mucous membranes are moist.     Pharynx: Oropharynx is clear.  Eyes:     Extraocular Movements: Extraocular movements intact.     Conjunctiva/sclera: Conjunctivae normal.     Pupils: Pupils are equal, round, and reactive to light.  Neck:     Vascular: No carotid bruit.  Cardiovascular:     Rate and Rhythm: Normal rate and regular rhythm.     Pulses: Normal pulses.     Heart sounds: No murmur heard.    No friction rub. No gallop.  Pulmonary:     Effort: Pulmonary effort is normal.     Breath sounds: Normal breath sounds.  Abdominal:     General: Abdomen is flat. Bowel sounds are normal. There is no distension.     Palpations: Abdomen is soft. There is no mass.     Tenderness: There is no abdominal tenderness. There is no guarding or rebound.     Hernia: No hernia is present.  Musculoskeletal:        General: Normal range of motion.     Cervical back: Normal range of motion and neck supple.  Lymphadenopathy:     Cervical: No cervical adenopathy.  Skin:    General: Skin is warm and dry.     Capillary Refill:  Capillary refill takes less than 2 seconds.  Neurological:     General: No focal deficit present.     Mental Status: He is alert and oriented to person, place, and time.  Psychiatric:        Mood and Affect: Mood normal.        Behavior: Behavior normal.        Thought Content: Thought content normal.        Judgment: Judgment normal.        Assessment & Plan:   1. Routine general medical examination at a health care facility (Primary) Today patient counseled on age appropriate routine health concerns for screening and prevention, each reviewed and up to date or declined. Immunizations reviewed and up to date or declined. Labs ordered and reviewed. Risk factors for depression reviewed and negative. Hearing function and visual acuity are intact. ADLs screened and addressed as needed. Functional ability and level of safety reviewed and appropriate. Education, counseling and referrals performed based on assessed risks today. Patient provided with a copy of personalized plan for preventive services.   2. Prediabetes - Continue with Mounjaro   - Lipid panel; Future - TSH; Future - CBC; Future - Comprehensive metabolic panel with GFR; Future - Hemoglobin A1c; Future  3. Class 1 obesity - He has lost 19 pounds over the last six months.  - Continue with weight loss measures  - Lipid panel; Future - TSH; Future - CBC; Future - Comprehensive metabolic panel with GFR; Future  4. BMI 32.0-32.9,adult  - Lipid panel; Future - TSH; Future - CBC; Future - Comprehensive metabolic panel with GFR; Future  5. Mixed hyperlipidemia - Consider increase in statin  - Lipid panel; Future - TSH; Future - CBC; Future - Comprehensive metabolic panel with GFR; Future  6. Benign prostatic hyperplasia, unspecified whether lower urinary tract symptoms present - Per urology  - PSA; Future  7. Hypogonadism in male - Per urology  - Lipid panel; Future - TSH; Future - CBC; Future - Comprehensive  metabolic panel with GFR; Future  Darleene Shape, NP

## 2024-06-16 ENCOUNTER — Telehealth: Payer: Self-pay

## 2024-06-16 ENCOUNTER — Other Ambulatory Visit (HOSPITAL_COMMUNITY): Payer: Self-pay

## 2024-06-16 DIAGNOSIS — E66811 Obesity, class 1: Secondary | ICD-10-CM

## 2024-06-16 NOTE — Telephone Encounter (Signed)
 Highest A1C is 5.9  Ozempic/Mounjaro  is approved exclusively as an adjunct to diet and exercise to improve glycemic  control in adults with type 2 diabetes mellitus. A review of patient's medical chart reveals no  documented diagnosis of type 2 diabetes or an A1C indicative of diabetes. Therefore, they do not  currently meet the criteria for prior authorization of this medication. If clinically appropriate, alternative  options such as Saxenda, Zepbound , or Wegovy  may be considered for this patient.  Ran test claim for Zepbound  15. Requires a PA. Please advise

## 2024-06-17 ENCOUNTER — Telehealth: Payer: Self-pay

## 2024-06-17 ENCOUNTER — Other Ambulatory Visit (HOSPITAL_COMMUNITY): Payer: Self-pay

## 2024-06-17 MED ORDER — TIRZEPATIDE-WEIGHT MANAGEMENT 15 MG/0.5ML ~~LOC~~ SOAJ: 15.0000 mg | Freq: Once | SUBCUTANEOUS | Status: DC

## 2024-06-17 NOTE — Telephone Encounter (Signed)
**Note De-identified  [place] Obfuscation** Please advise 

## 2024-06-17 NOTE — Telephone Encounter (Signed)
 Pharmacy Patient Advocate Encounter   Received notification from Pt Calls Messages that prior authorization for Zepbound  15 is required/requested.   Insurance verification completed.   The patient is insured through [facility] .   Per test claim: PA required; PA submitted to above mentioned insurance via CoverMyMeds Key/confirmation #/EOC [id number] Status is pending

## 2024-06-17 NOTE — Telephone Encounter (Signed)
 Zepbound  sent to pharmacy mounjaro  d/c. Pt notified of update and verbalized understanding.

## 2024-06-20 ENCOUNTER — Other Ambulatory Visit: Payer: Self-pay

## 2024-06-20 ENCOUNTER — Other Ambulatory Visit (HOSPITAL_COMMUNITY): Payer: Self-pay

## 2024-06-20 MED ORDER — TIRZEPATIDE-WEIGHT MANAGEMENT 15 MG/0.5ML ~~LOC~~ SOAJ
15.0000 mg | SUBCUTANEOUS | 1 refills | Status: DC
  Filled 2024-06-20: qty 2, 28d supply, fill #0
  Filled 2024-06-20: qty 6, 84d supply, fill #1
  Filled 2024-06-20: qty 2, 28d supply, fill #0
  Filled 2024-07-18 – 2024-07-31 (×2): qty 2, 28d supply, fill #1
  Filled 2024-08-24: qty 2, 28d supply, fill #2
  Filled 2024-09-20 – 2024-10-01 (×2): qty 2, 28d supply, fill #3
  Filled 2024-10-25: qty 2, 28d supply, fill #4
  Filled 2024-11-23: qty 2, 28d supply, fill #5

## 2024-06-20 NOTE — Telephone Encounter (Signed)
 Pharmacy Patient Advocate Encounter  Received notification from [facility] that Prior Authorization for Zepbound  15 has been APPROVED from 06/19/24 to 12/14/24. Ran test claim, Copay is $35.00. This test claim was processed through [facility]- copay amounts may vary at other pharmacies due to pharmacy/plan contracts, or as the patient moves through the different stages of their insurance plan.   PA #/Case ID/Reference #: [id number]  If patient pharmacy submits OCC 4 on COB with [facility], co pay is $0

## 2024-06-20 NOTE — Telephone Encounter (Signed)
 Patient notified of update  and verbalized understanding. Rx sent to [facility] pharmacy per pt.

## 2024-07-07 DIAGNOSIS — E349 Endocrine disorder, unspecified: Secondary | ICD-10-CM | POA: Diagnosis not present

## 2024-07-10 DIAGNOSIS — E349 Endocrine disorder, unspecified: Secondary | ICD-10-CM | POA: Diagnosis not present

## 2024-07-10 DIAGNOSIS — R350 Frequency of micturition: Secondary | ICD-10-CM | POA: Diagnosis not present

## 2024-07-10 DIAGNOSIS — N401 Enlarged prostate with lower urinary tract symptoms: Secondary | ICD-10-CM | POA: Diagnosis not present

## 2024-07-28 ENCOUNTER — Other Ambulatory Visit (HOSPITAL_COMMUNITY): Payer: Self-pay

## 2024-07-31 ENCOUNTER — Other Ambulatory Visit (HOSPITAL_COMMUNITY): Payer: Self-pay

## 2024-08-30 ENCOUNTER — Other Ambulatory Visit (HOSPITAL_COMMUNITY): Payer: Self-pay

## 2024-09-15 ENCOUNTER — Encounter: Payer: Self-pay | Admitting: Radiology

## 2024-09-28 DIAGNOSIS — M545 Low back pain, unspecified: Secondary | ICD-10-CM | POA: Diagnosis not present

## 2024-09-30 ENCOUNTER — Other Ambulatory Visit (HOSPITAL_COMMUNITY): Payer: Self-pay

## 2024-10-04 ENCOUNTER — Other Ambulatory Visit (HOSPITAL_COMMUNITY): Payer: Self-pay

## 2024-11-02 ENCOUNTER — Other Ambulatory Visit (HOSPITAL_COMMUNITY): Payer: Self-pay

## 2024-11-05 ENCOUNTER — Other Ambulatory Visit (HOSPITAL_COMMUNITY): Payer: Self-pay

## 2024-12-12 ENCOUNTER — Encounter: Payer: Self-pay | Admitting: Adult Health

## 2024-12-12 ENCOUNTER — Ambulatory Visit: Admitting: Adult Health

## 2024-12-12 VITALS — BP 130/88 | HR 90 | Temp 98.6°F | Ht 69.0 in | Wt 227.0 lb

## 2024-12-12 DIAGNOSIS — Z6833 Body mass index (BMI) 33.0-33.9, adult: Secondary | ICD-10-CM

## 2024-12-12 DIAGNOSIS — R7303 Prediabetes: Secondary | ICD-10-CM | POA: Diagnosis not present

## 2024-12-12 DIAGNOSIS — E66811 Obesity, class 1: Secondary | ICD-10-CM | POA: Diagnosis not present

## 2024-12-12 LAB — POCT GLYCOSYLATED HEMOGLOBIN (HGB A1C): Hemoglobin A1C: 5 % (ref 4.0–5.6)

## 2024-12-12 MED ORDER — TIRZEPATIDE-WEIGHT MANAGEMENT 15 MG/0.5ML ~~LOC~~ SOAJ: 15.0000 mg | SUBCUTANEOUS | 1 refills | Status: AC

## 2024-12-12 NOTE — Progress Notes (Signed)
 "  Subjective:    Patient ID: Jesus Allen, male    DOB: 09/23/1971, 54 y.o.   MRN: [id number]  HPI 54 year old male who  has a past medical history of ED (erectile dysfunction), Hyperlipidemia, Obesity, and Pneumonia.  He presents to the office today for follow up regarding prediabets and obesity   Prediabetes  -managed currently with Zepbound  15 mg weekly.  Lab Results  Component Value Date   HGBA1C 5.0 12/12/2024   HGBA1C 5.1 06/06/2024   HGBA1C 4.9 12/07/2023   Obesity -  He reports that he has been traveling a lot and has not been eating as healthy as he should or working out as much.  Wt Readings from Last 3 Encounters:  12/12/24 227 lb (103 kg)  06/06/24 228 lb (103.4 kg)  12/07/23 247 lb (112 kg)    Review of Systems See HPI   Past Medical History:  Diagnosis Date   ED (erectile dysfunction)    Hyperlipidemia    Obesity    Pneumonia     Social History   Socioeconomic History   Marital status: Married    Spouse name: Not on file   Number of children: Not on file   Years of education: Not on file   Highest education level: Master's degree (e.g., MA, MS, MEng, MEd, MSW, MBA)  Occupational History   Not on file  Tobacco Use   Smoking status: Never   Smokeless tobacco: Never  Vaping Use   Vaping status: Never Used  Substance and Sexual Activity   Alcohol use: Yes    Alcohol/week: 4.0 standard drinks of alcohol    Types: 4 Shots of liquor per week    Comment: social   Drug use: No   Sexual activity: Not on file  Other Topics Concern   Not on file  Social History Narrative   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   Married    Two children ( 13 and 17)    Social Drivers of Health   Tobacco Use: Low Risk (12/12/2024)   Patient History    Smoking Tobacco Use: Never    Smokeless Tobacco Use: Never    Passive Exposure: Not on file  Financial Resource Strain: Patient Declined (12/11/2024)   Overall Financial Resource Strain (CARDIA)     Difficulty of Paying Living Expenses: Patient declined  Food Insecurity: Patient Declined (12/11/2024)   Epic    Worried About Programme Researcher, Broadcasting/film/video in the Last Year: Patient declined    Barista in the Last Year: Patient declined  Transportation Needs: Patient Declined (12/11/2024)   Epic    Lack of Transportation (Medical): Patient declined    Lack of Transportation (Non-Medical): Patient declined  Physical Activity: Insufficiently Active (12/11/2024)   Exercise Vital Sign    Days of Exercise per Week: 4 days    Minutes of Exercise per Session: 30 min  Stress: No Stress Concern Present (12/11/2024)   [employer] of Occupational Health - Occupational Stress Questionnaire    Feeling of Stress: Only a little  Social Connections: Unknown (12/11/2024)   Social Connection and Isolation Panel    Frequency of Communication with Friends and Family: Patient declined    Frequency of Social Gatherings with Friends and Family: Patient declined    Attends Religious Services: Patient declined    Database Administrator or Organizations: Patient declined    Attends Banker Meetings: Not on file  Marital Status: Patient declined  Intimate Partner Violence: Not on file  Depression (PHQ2-9): Low Risk (12/07/2023)   Depression (PHQ2-9)    PHQ-2 Score: 0  Alcohol Screen: Low Risk (06/05/2024)   Alcohol Screen    Last Alcohol Screening Score (AUDIT): 2  Housing: Patient Declined (12/11/2024)   Epic    Unable to Pay for Housing in the Last Year: Patient declined    Number of Times Moved in the Last Year: Not on file    Homeless in the Last Year: Patient declined  Utilities: Not on file  Health Literacy: Not on file    Past Surgical History:  Procedure Laterality Date   FOOT FRACTURE SURGERY Left    FRACTURE SURGERY     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   Procedure: LEFT TOTAL HIP ARTHROPLASTY ANTERIOR APPROACH;  Surgeon: Jerri Kay HERO, MD;  Location: [facility] OR;  Service:  Orthopedics;  Laterality: Left;  3-C    Family History  Problem Relation Age of Onset   Ovarian cancer Mother    Diabetes Father    Blindness Father    Stroke Maternal Grandmother    Colon cancer Neg Hx    Colon polyps Neg Hx    Esophageal cancer Neg Hx    Stomach cancer Neg Hx    Rectal cancer Neg Hx     Allergies[1]  Medications Ordered Prior to Encounter[2]  BP 130/88   Pulse 90   Temp 98.6 F (37 C) (Oral)   Ht 5' 9 (1.753 m)   Wt 227 lb (103 kg)   SpO2 98%   BMI 33.52 kg/m       Objective:   Physical Exam Vitals and nursing note reviewed.  Constitutional:      Appearance: Normal appearance. He is obese.  Cardiovascular:     Rate and Rhythm: Normal rate and regular rhythm.     Pulses: Normal pulses.     Heart sounds: Normal heart sounds.  Pulmonary:     Effort: Pulmonary effort is normal.     Breath sounds: Normal breath sounds.  Skin:    General: Skin is warm and dry.  Neurological:     General: No focal deficit present.     Mental Status: He is alert and oriented to person, place, and time.  Psychiatric:        Mood and Affect: Mood normal.        Behavior: Behavior normal.        Thought Content: Thought content normal.        Judgment: Judgment normal.        Assessment & Plan:  1. Prediabetes (Primary)  - POC HgB A1c- 5.0  - Continue with Zepboudn 15 mg weekly. - Follow up in 6 months for CPE  2. Class 1 obesity - Weight has held steady. Encouraged healthy eating and exercise   3. BMI 33.0-33.9,adult  Darleene Shape, NP      [1] No Known Allergies [2]  Current Outpatient Medications on File Prior to Visit  Medication Sig Dispense Refill   ketoconazole (NIZORAL) 2 % cream Apply 1 application. topically daily.     ketoconazole (NIZORAL) 2 % shampoo Apply 1 application. topically 2 (two) times a week.     Multiple Vitamins-Minerals (MULTIVITAMIN WITH MINERALS) tablet Take 1 tablet by mouth daily. One a day men     rosuvastatin   (CRESTOR ) 5 MG tablet TAKE 1 TABLET (5 MG TOTAL) BY MOUTH DAILY. KEEP SCHEDULED APPT WITH Aarya Quebedeaux. 90 tablet  1   sildenafil (VIAGRA) 100 MG tablet Take 100 mg by mouth daily as needed for erectile dysfunction.     testosterone  cypionate (DEPOTESTOSTERONE CYPIONATE) 200 MG/ML injection Inject 200 mg into the skin every 14 (fourteen) days.     tirzepatide  (ZEPBOUND ) 15 MG/0.5ML Pen Inject 15 mg into the skin once a week. 6 mL 1   tamsulosin (FLOMAX) 0.4 MG CAPS capsule Take 0.4 mg by mouth at bedtime. (Patient not taking: Reported on 12/12/2024)     No current facility-administered medications on file prior to visit.   "

## 2024-12-12 NOTE — Patient Instructions (Signed)
 Health Maintenance Due  Topic Date Due   DTaP/Tdap/Td (1 - Tdap) Never done   Hepatitis B Vaccines 19-59 Average Risk (1 of 3 - 19+ 3-dose series) Never done   Pneumococcal Vaccine: 50+ Years (1 of 1 - PCV) Never done   Influenza Vaccine  06/13/2024   COVID-19 Vaccine (3 - 2025-26 season) 07/14/2024       12/07/2023    8:05 AM 12/09/2021   10:22 AM  Depression screen PHQ 2/9  Decreased Interest 0 0  Down, Depressed, Hopeless 0 0  PHQ - 2 Score 0 0

## 2024-12-12 NOTE — Addendum Note (Signed)
 Addended by: MERNA DARLEENE CROME on: 12/12/2024 12:55 PM   Modules accepted: Level of Service

## 2025-06-12 ENCOUNTER — Encounter: Payer: PRIVATE HEALTH INSURANCE | Admitting: Adult Health
